# Patient Record
Sex: Female | Born: 1990
Health system: Southern US, Community
[De-identification: ages and names within clinical notes are randomized; demographics above are authoritative.]

## PROBLEM LIST (undated history)

## (undated) ENCOUNTER — Inpatient Hospital Stay (HOSPITAL_COMMUNITY): Payer: Self-pay

## (undated) DIAGNOSIS — F32A Depression, unspecified: Secondary | ICD-10-CM

## (undated) DIAGNOSIS — A6009 Herpesviral infection of other urogenital tract: Secondary | ICD-10-CM

## (undated) DIAGNOSIS — R519 Headache, unspecified: Secondary | ICD-10-CM

## (undated) DIAGNOSIS — N760 Acute vaginitis: Secondary | ICD-10-CM

## (undated) DIAGNOSIS — B9689 Other specified bacterial agents as the cause of diseases classified elsewhere: Secondary | ICD-10-CM

## (undated) DIAGNOSIS — J45909 Unspecified asthma, uncomplicated: Secondary | ICD-10-CM

## (undated) DIAGNOSIS — F329 Major depressive disorder, single episode, unspecified: Secondary | ICD-10-CM

## (undated) DIAGNOSIS — Z789 Other specified health status: Secondary | ICD-10-CM

## (undated) DIAGNOSIS — R51 Headache: Secondary | ICD-10-CM

## (undated) DIAGNOSIS — N390 Urinary tract infection, site not specified: Secondary | ICD-10-CM

## (undated) HISTORY — DX: Urinary tract infection, site not specified: N39.0

## (undated) HISTORY — DX: Herpesviral infection of other urogenital tract: A60.09

## (undated) HISTORY — PX: NO PAST SURGERIES: SHX2092

## (undated) HISTORY — DX: Headache: R51

## (undated) HISTORY — DX: Acute vaginitis: B96.89

## (undated) HISTORY — DX: Major depressive disorder, single episode, unspecified: F32.9

## (undated) HISTORY — DX: Headache, unspecified: R51.9

## (undated) HISTORY — DX: Depression, unspecified: F32.A

## (undated) HISTORY — DX: Unspecified asthma, uncomplicated: J45.909

## (undated) HISTORY — DX: Other specified bacterial agents as the cause of diseases classified elsewhere: N76.0

---

## 2017-03-09 ENCOUNTER — Inpatient Hospital Stay (HOSPITAL_COMMUNITY)
Admission: AD | Admit: 2017-03-09 | Discharge: 2017-03-09 | Disposition: A | Discharge status: Home / Self Care | Payer: Medicaid Other | Source: Ambulatory Visit | Attending: Obstetrics & Gynecology | Admitting: Obstetrics & Gynecology

## 2017-03-09 ENCOUNTER — Inpatient Hospital Stay (HOSPITAL_COMMUNITY): Payer: Medicaid Other

## 2017-03-09 ENCOUNTER — Encounter (HOSPITAL_COMMUNITY): Payer: Self-pay | Admitting: *Deleted

## 2017-03-09 ENCOUNTER — Other Ambulatory Visit: Payer: Self-pay

## 2017-03-09 DIAGNOSIS — O26899 Other specified pregnancy related conditions, unspecified trimester: Secondary | ICD-10-CM

## 2017-03-09 DIAGNOSIS — Z3A01 Less than 8 weeks gestation of pregnancy: Secondary | ICD-10-CM

## 2017-03-09 DIAGNOSIS — O26891 Other specified pregnancy related conditions, first trimester: Secondary | ICD-10-CM | POA: Diagnosis not present

## 2017-03-09 DIAGNOSIS — O3680X0 Pregnancy with inconclusive fetal viability, not applicable or unspecified: Secondary | ICD-10-CM

## 2017-03-09 DIAGNOSIS — O209 Hemorrhage in early pregnancy, unspecified: Secondary | ICD-10-CM | POA: Diagnosis present

## 2017-03-09 DIAGNOSIS — Z6791 Unspecified blood type, Rh negative: Secondary | ICD-10-CM

## 2017-03-09 HISTORY — DX: Other specified health status: Z78.9

## 2017-03-09 LAB — URINALYSIS, ROUTINE W REFLEX MICROSCOPIC
BILIRUBIN URINE: NEGATIVE
Glucose, UA: NEGATIVE mg/dL
Ketones, ur: NEGATIVE mg/dL
Leukocytes, UA: NEGATIVE
Nitrite: NEGATIVE
PH: 7 (ref 5.0–8.0)
Protein, ur: NEGATIVE mg/dL
SPECIFIC GRAVITY, URINE: 1.009 (ref 1.005–1.030)

## 2017-03-09 LAB — CBC
HCT: 42.5 % (ref 36.0–46.0)
HEMOGLOBIN: 13.8 g/dL (ref 12.0–15.0)
MCH: 28.2 pg (ref 26.0–34.0)
MCHC: 32.5 g/dL (ref 30.0–36.0)
MCV: 86.7 fL (ref 78.0–100.0)
PLATELETS: 326 10*3/uL (ref 150–400)
RBC: 4.9 MIL/uL (ref 3.87–5.11)
RDW: 14.1 % (ref 11.5–15.5)
WBC: 5.6 10*3/uL (ref 4.0–10.5)

## 2017-03-09 LAB — WET PREP, GENITAL
SPERM: NONE SEEN
TRICH WET PREP: NONE SEEN
YEAST WET PREP: NONE SEEN

## 2017-03-09 LAB — HCG, QUANTITATIVE, PREGNANCY: hCG, Beta Chain, Quant, S: 179 m[IU]/mL — ABNORMAL HIGH (ref <5)

## 2017-03-09 LAB — ABO/RH: ABO/RH(D): A NEG

## 2017-03-09 MED ORDER — RHO D IMMUNE GLOBULIN 1500 UNIT/2ML IJ SOSY
300.0000 ug | PREFILLED_SYRINGE | Freq: Once | INTRAMUSCULAR | Status: AC
  Administered 2017-03-09: 300 ug via INTRAMUSCULAR
  Filled 2017-03-09: qty 2

## 2017-03-09 NOTE — MAU Note (Signed)
Pt presents with c/o spotting with wiping on Friday and now having bright red VB since Sunday that requires a sanitary napkin.  Reports has had intermittent lower abdominal cramping and back pain.

## 2017-03-09 NOTE — Discharge Instructions (Signed)
Ectopic Pregnancy An ectopic pregnancy is when the fertilized egg attaches (implants) outside the uterus. Most ectopic pregnancies occur in one of the tubes where eggs travel from the ovary to the uterus (fallopian tubes), but the implanting can occur in other locations. In rare cases, ectopic pregnancies occur on the ovary, intestine, pelvis, abdomen, or cervix. In an ectopic pregnancy, the fertilized egg does not have the ability to develop into a normal, healthy baby. A ruptured ectopic pregnancy is one in which tearing or bursting of a fallopian tube causes internal bleeding. Often, there is intense lower abdominal pain, and vaginal bleeding sometimes occurs. Having an ectopic pregnancy can be life-threatening. If this dangerous condition is not treated, it can lead to blood loss, shock, or even death. What are the causes? The most common cause of this condition is damage to one of the fallopian tubes. A fallopian tube may be narrowed or blocked, and that keeps the fertilized egg from reaching the uterus. What increases the risk? This condition is more likely to develop in women of childbearing age who have different levels of risk. The levels of risk can be divided into three categories. High risk  You have gone through infertility treatment.  You have had an ectopic pregnancy before.  You have had surgery on the fallopian tubes, or another surgical procedure, such as an abortion.  You have had surgery to have the fallopian tubes tied (tubal ligation).  You have problems or diseases of the fallopian tubes.  You have been exposed to diethylstilbestrol (DES). This medicine was used until 1971, and it had effects on babies whose mothers took the medicine.  You become pregnant while using an IUD (intrauterine device) for birth control. Moderate risk  You have a history of infertility.  You have had an STI (sexually transmitted infection).  You have a history of pelvic inflammatory  disease (PID).  You have scarring from endometriosis.  You have multiple sexual partners.  You smoke. Low risk  You have had pelvic surgery.  You use vaginal douches.  You became sexually active before age 18. What are the signs or symptoms? Common symptoms of this condition include normal pregnancy symptoms, such as missing a period, nausea, tiredness, abdominal pain, breast tenderness, and bleeding. However, ectopic pregnancy will have additional symptoms, such as:  Pain with intercourse.  Irregular vaginal bleeding or spotting.  Cramping or pain on one side or in the lower abdomen.  Fast heartbeat, low blood pressure, and sweating.  Passing out while having a bowel movement.  Symptoms of a ruptured ectopic pregnancy and internal bleeding may include:  Sudden, severe pain in the abdomen and pelvis.  Dizziness, weakness, light-headedness, or fainting.  Pain in the shoulder or neck area.  How is this diagnosed? This condition is diagnosed by:  A pelvic exam to locate pain or a mass in the abdomen.  A pregnancy test. This blood test checks for the presence as well as the specific level of pregnancy hormone in the bloodstream.  Ultrasound. This is performed if a pregnancy test is positive. In this test, a probe is inserted into the vagina. The probe will detect a fetus, possibly in a location other than the uterus.  Taking a sample of uterus tissue (dilation and curettage, or D&C).  Surgery to perform a visual exam of the inside of the abdomen using a thin, lighted tube that has a tiny camera on the end (laparoscope).  Culdocentesis. This procedure involves inserting a needle at the top   of the vagina, behind the uterus. If blood is present in this area, it may indicate that a fallopian tube is torn.  How is this treated? This condition is treated with medicine or surgery. Medicine  An injection of a medicine (methotrexate) may be given to cause the pregnancy tissue  to be absorbed. This medicine may save your fallopian tube. It may be given if: ? The diagnosis is made early, with no signs of active bleeding. ? The fallopian tube has not ruptured. ? You are considered to be a good candidate for the medicine. Usually, pregnancy hormone blood levels are checked after methotrexate treatment. This is to be sure that the medicine is effective. It may take 4-6 weeks for the pregnancy to be absorbed. Most pregnancies will be absorbed by 3 weeks. Surgery  A laparoscope may be used to remove the pregnancy tissue.  If severe internal bleeding occurs, a larger cut (incision) may be made in the lower abdomen (laparotomy) to remove the fetus and placenta. This is done to stop the bleeding.  Part or all of the fallopian tube may be removed (salpingectomy) along with the fetus and placenta. The fallopian tube may also be repaired during the surgery.  In very rare circumstances, removal of the uterus (hysterectomy) may be required.  After surgery, pregnancy hormone testing may be done to be sure that there is no pregnancy tissue left. Whether your treatment is medicine or surgery, you may receive a Rho (D) immune globulin shot to prevent problems with any future pregnancy. This shot may be given if:  You are Rh-negative and the baby's father is Rh-positive.  You are Rh-negative and you do not know the Rh type of the baby's father.  Follow these instructions at home:  Rest and limit your activity after the procedure for as long as told by your health care provider.  Until your health care provider says that it is safe: ? Do not lift anything that is heavier than 10 lb (4.5 kg), or the limit that your health care provider tells you. ? Avoid physical exercise and any movement that requires effort (is strenuous).  To help prevent constipation: ? Eat a healthy diet that includes fruits, vegetables, and whole grains. ? Drink 6-8 glasses of water per day. Get help  right away if:  You develop worsening pain that is not relieved by medicine.  You have: ? A fever or chills. ? Vaginal bleeding. ? Redness and swelling at the incision site. ? Nausea and vomiting.  You feel dizzy or weak.  You feel light-headed or you faint. This information is not intended to replace advice given to you by your health care provider. Make sure you discuss any questions you have with your health care provider. Document Released: 04/23/2004 Document Revised: 11/13/2015 Document Reviewed: 10/16/2015 Elsevier Interactive Patient Education  2018 [employer]  

## 2017-03-09 NOTE — MAU Provider Note (Signed)
History     CSN: [id number]  Arrival date and time: 03/09/17 1338   First Provider Initiated Contact with Patient 03/09/17 1443      Chief Complaint  Patient presents with  . Vaginal Bleeding   Amanda Kerr is a 26 y.o. G1P0 at [redacted]w[redacted]d who presents today with vaginal bleeding. She started spotting on 12/7, and then on 12/9 it became heavier. It requires the use of a pantiliner, with small pea sized clots. She also started to have cramping on 03/07/17. +UPT at the HD.    Vaginal Bleeding  The patient's primary symptoms include pelvic pain and vaginal bleeding. This is a new problem. The current episode started in the past 7 days. The problem occurs constantly. The problem has been gradually worsening. Pain severity now: 4/10. The problem affects both sides. She is pregnant. Pertinent negatives include no chills, dysuria, fever, frequency, nausea, urgency or vomiting. The vaginal discharge was bloody. The vaginal bleeding is lighter than menses. She has been passing clots. She has not been passing tissue. Nothing aggravates the symptoms. She has tried nothing for the symptoms. She uses nothing for contraception. Her menstrual history has been regular (LMP 01/24/17).    Past Medical History:  Diagnosis Date  . Medical history non-contributory     Past Surgical History:  Procedure Laterality Date  . NO PAST SURGERIES      Family History  Problem Relation Age of Onset  . Hypertension Mother   . Diabetes Maternal Grandmother   . Glaucoma Maternal Grandmother   . Heart disease Maternal Grandmother     Social History   Tobacco Use  . Smoking status: Never Smoker  . Smokeless tobacco: Never Used  Substance Use Topics  . Alcohol use: No    Frequency: Never    Comment: occasional  . Drug use: No    Allergies: No Known Allergies  No medications prior to admission.    Review of Systems  Constitutional: Negative for chills and fever.  Gastrointestinal: Negative for nausea and  vomiting.  Genitourinary: Positive for pelvic pain and vaginal bleeding. Negative for dysuria, frequency and urgency.   Physical Exam   Blood pressure 119/85, pulse 99, temperature 98.2 F (36.8 C), temperature source Oral, resp. rate 20, height 5\' 7"  (1.702 m), weight 182 lb 4 oz (82.7 kg), last menstrual period 01/24/2017, SpO2 100 %.  Physical Exam  Nursing note and vitals reviewed. Constitutional: She is oriented to person, place, and time. She appears well-developed and well-nourished. No distress.  HENT:  Head: Normocephalic.  Cardiovascular: Normal rate.  Respiratory: Effort normal.  GI: Soft. There is no tenderness. There is no rebound.  Neurological: She is alert and oriented to person, place, and time.  Skin: Skin is warm and dry.  Psychiatric: She has a normal mood and affect.     Results for orders placed or performed during the hospital encounter of 03/09/17 (from the past 24 hour(s))  Urinalysis, Routine w reflex microscopic     Status: Abnormal   Collection Time: 03/09/17  2:00 PM  Result Value Ref Range   Color, Urine YELLOW YELLOW   APPearance HAZY (A) CLEAR   Specific Gravity, Urine 1.009 1.005 - 1.030   pH 7.0 5.0 - 8.0   Glucose, UA NEGATIVE NEGATIVE mg/dL   Hgb urine dipstick LARGE (A) NEGATIVE   Bilirubin Urine NEGATIVE NEGATIVE   Ketones, ur NEGATIVE NEGATIVE mg/dL   Protein, ur NEGATIVE NEGATIVE mg/dL   Nitrite NEGATIVE NEGATIVE   Leukocytes, UA  NEGATIVE NEGATIVE   RBC / HPF TOO NUMEROUS TO COUNT 0 - 5 RBC/hpf   WBC, UA 6-30 0 - 5 WBC/hpf   Bacteria, UA RARE (A) NONE SEEN   Squamous Epithelial / LPF 0-5 (A) NONE SEEN   Mucus PRESENT   CBC     Status: None   Collection Time: 03/09/17  3:21 PM  Result Value Ref Range   WBC 5.6 4.0 - 10.5 K/uL   RBC 4.90 3.87 - 5.11 MIL/uL   Hemoglobin 13.8 12.0 - 15.0 g/dL   HCT 40.942.5 81.136.0 - 91.446.0 %   MCV 86.7 78.0 - 100.0 fL   MCH 28.2 26.0 - 34.0 pg   MCHC 32.5 30.0 - 36.0 g/dL   RDW 78.214.1 95.611.5 - 21.315.5 %    Platelets 326 150 - 400 K/uL  hCG, quantitative, pregnancy     Status: Abnormal   Collection Time: 03/09/17  3:21 PM  Result Value Ref Range   hCG, Beta Chain, Quant, S 179 (H) <5 mIU/mL  ABO/Rh     Status: None (Preliminary result)   Collection Time: 03/09/17  3:21 PM  Result Value Ref Range   ABO/RH(D) A NEG   Rh IG workup (includes ABO/Rh)     Status: None (Preliminary result)   Collection Time: 03/09/17  3:21 PM  Result Value Ref Range   Gestational Age(Wks) 6    ABO/RH(D) A NEG    Antibody Screen PENDING     [place] Ob Comp Less 14 Wks  Result Date: 03/09/2017 CLINICAL DATA:  Vaginal bleeding and cramping for 4 days. Gestational age by LMP of 6 weeks 2 days. EXAM: OBSTETRIC <14 WK US AND TRANSVAGINAL OB US TECHNIQUE: Both transabdominal and transvaginal ultrasound examinations were performed for complete evaluation of the gestation as well as the maternal uterus, adnexal regions, and pelvic cul-de-sac. Transvaginal technique was performed to assess early pregnancy. COMPARISON:  None. FINDINGS: Intrauterine gestational sac: None Maternal uterus/adnexae: Endometrial thickness measures 5 mm. No fibroids identified. Both ovaries are normal in appearance. No adnexal mass or free fluid identified. IMPRESSION: Pregnancy of unknown anatomic location (no intrauterine gestational sac or adnexal mass identified). Differential diagnosis includes recent spontaneous abortion, IUP too early to visualize, and non-visualized ectopic pregnancy. Recommend correlation with serial beta-hCG levels, and follow up US as clinically warranted. Electronically Signed   By: Myles RosenthalJohn  Stahl M.D.   On: 03/09/2017 16:39   [place] Ob Transvaginal  Result Date: 03/09/2017 CLINICAL DATA:  Vaginal bleeding and cramping for 4 days. Gestational age by LMP of 6 weeks 2 days. EXAM: OBSTETRIC <14 WK US AND TRANSVAGINAL OB US TECHNIQUE: Both transabdominal and transvaginal ultrasound examinations were performed for complete evaluation of the  gestation as well as the maternal uterus, adnexal regions, and pelvic cul-de-sac. Transvaginal technique was performed to assess early pregnancy. COMPARISON:  None. FINDINGS: Intrauterine gestational sac: None Maternal uterus/adnexae: Endometrial thickness measures 5 mm. No fibroids identified. Both ovaries are normal in appearance. No adnexal mass or free fluid identified. IMPRESSION: Pregnancy of unknown anatomic location (no intrauterine gestational sac or adnexal mass identified). Differential diagnosis includes recent spontaneous abortion, IUP too early to visualize, and non-visualized ectopic pregnancy. Recommend correlation with serial beta-hCG levels, and follow up US as clinically warranted. Electronically Signed   By: Myles RosenthalJohn  Stahl M.D.   On: 03/09/2017 16:39   MAU Course  Procedures  MDM Rhogam given today.   Assessment and Plan   1. Pregnancy, location unknown   2. Rh negative, antepartum  DC home Comfort measures reviewed  Rhogam given today  Bleeding precautions Ectopic precautions RX: none  Return to MAU as needed FU with the clinic on 03/11/17 for repeat blood work   Follow-up Information    [facility]   Specialty:  Obstetrics and Gynecology Why:  Thursday 03/11/17 for repeat blood work  Contact information: [address] [place] [id number] [phone]           Thressa ShellerHeather Hogan 03/09/2017, 2:44 PM

## 2017-03-10 LAB — GC/CHLAMYDIA PROBE AMP (~~LOC~~) NOT AT ARMC
Chlamydia: NEGATIVE
Neisseria Gonorrhea: NEGATIVE

## 2017-03-10 LAB — RH IG WORKUP (INCLUDES ABO/RH)
ABO/RH(D): A NEG
Antibody Screen: NEGATIVE
GESTATIONAL AGE(WKS): 6
Unit division: 0

## 2017-03-11 ENCOUNTER — Ambulatory Visit: Payer: Self-pay | Admitting: *Deleted

## 2017-03-11 DIAGNOSIS — O3680X0 Pregnancy with inconclusive fetal viability, not applicable or unspecified: Secondary | ICD-10-CM

## 2017-03-11 LAB — HCG, QUANTITATIVE, PREGNANCY: HCG, BETA CHAIN, QUANT, S: 67 m[IU]/mL — AB (ref <5)

## 2017-03-11 NOTE — Progress Notes (Signed)
Here for stat bhcg today. C/o spotting dark brown. Denies pain. Explained to her we will draw a stat bhcg and have her wait for results , then review with provider and her ; along with plan of care.  Results back; discussed with Dr. Shawnie PonsPratt and then informed patient it appears she has had a miscarriage and that we recommend a non stat bhcg in one week and then see a provider in 2 weeks for follow up.Ectopic precautions given.  Answered her questions and she voices understanding.

## 2017-03-11 NOTE — Progress Notes (Signed)
Patient seen and assessed by nursing staff.  Agree with documentation and plan.  

## 2017-03-19 ENCOUNTER — Other Ambulatory Visit: Payer: Medicaid Other

## 2017-03-19 DIAGNOSIS — O3680X0 Pregnancy with inconclusive fetal viability, not applicable or unspecified: Secondary | ICD-10-CM

## 2017-03-20 LAB — BETA HCG QUANT (REF LAB): HCG QUANT: 4 m[IU]/mL

## 2017-03-25 ENCOUNTER — Ambulatory Visit (INDEPENDENT_AMBULATORY_CARE_PROVIDER_SITE_OTHER): Payer: Medicaid Other | Admitting: Obstetrics and Gynecology

## 2017-03-25 ENCOUNTER — Encounter: Payer: Self-pay | Admitting: Obstetrics and Gynecology

## 2017-03-25 VITALS — BP 134/68 | HR 75 | Ht 67.0 in | Wt 184.4 lb

## 2017-03-25 DIAGNOSIS — A6000 Herpesviral infection of urogenital system, unspecified: Secondary | ICD-10-CM

## 2017-03-25 DIAGNOSIS — O039 Complete or unspecified spontaneous abortion without complication: Secondary | ICD-10-CM | POA: Diagnosis present

## 2017-03-25 NOTE — Progress Notes (Signed)
GYNECOLOGY OFFICE VISIT NOTE  History:  26 y.o. G1P0010 here today for follow up from SAB. She denies any abnormal vaginal discharge, bleeding, pelvic pain or other concerns.   Past Medical History:  Diagnosis Date  . Medical history non-contributory     Past Surgical History:  Procedure Laterality Date  . NO PAST SURGERIES       Current Outpatient Medications:  .  Prenatal Vit-Fe Fumarate-FA (PRENATAL MULTIVITAMIN) TABS tablet, Take 1 tablet by mouth daily at 12 noon., Disp: , Rfl:  .  valACYclovir (VALTREX) 500 MG tablet, Take 500 mg by mouth daily as needed (outbreak)., Disp: , Rfl:   The following portions of the patient's history were reviewed and updated as appropriate: allergies, current medications, past family history, past medical history, past social history, past surgical history and problem list.    Review of Systems:  Pertinent items noted in HPI and remainder of comprehensive ROS otherwise negative.   Objective:  Physical Exam BP 134/68   Pulse 75   Ht 5\' 7"  (1.702 m)   Wt 184 lb 6.4 oz (83.6 kg)   LMP 01/24/2017 (Approximate)   BMI 28.88 kg/m  CONSTITUTIONAL: Well-developed, well-nourished female in no acute distress.  HENT:  Normocephalic, atraumatic. External right and left ear normal. Oropharynx is clear and moist EYES: Conjunctivae and EOM are normal. Pupils are equal, round, and reactive to light. No scleral icterus.  NECK: Normal range of motion, supple, no masses SKIN: Skin is warm and dry. No rash noted. Not diaphoretic. No erythema. No pallor. NEUROLOGIC: Alert and oriented to person, place, and time. Normal reflexes, muscle tone coordination. No cranial nerve deficit noted. PSYCHIATRIC: Normal mood and affect. Normal behavior. Normal judgment and thought content. CARDIOVASCULAR: Normal heart rate noted RESPIRATORY: Effort normal, no problems with respiration noted ABDOMEN: Soft, no distention noted.   PELVIC: Deferred MUSCULOSKELETAL: Normal  range of motion. No edema noted.  Labs and Imaging [place] Ob Comp Less 14 Wks  Result Date: 03/09/2017 CLINICAL DATA:  Vaginal bleeding and cramping for 4 days. Gestational age by LMP of 6 weeks 2 days. EXAM: OBSTETRIC <14 WK US AND TRANSVAGINAL OB US TECHNIQUE: Both transabdominal and transvaginal ultrasound examinations were performed for complete evaluation of the gestation as well as the maternal uterus, adnexal regions, and pelvic cul-de-sac. Transvaginal technique was performed to assess early pregnancy. COMPARISON:  None. FINDINGS: Intrauterine gestational sac: None Maternal uterus/adnexae: Endometrial thickness measures 5 mm. No fibroids identified. Both ovaries are normal in appearance. No adnexal mass or free fluid identified. IMPRESSION: Pregnancy of unknown anatomic location (no intrauterine gestational sac or adnexal mass identified). Differential diagnosis includes recent spontaneous abortion, IUP too early to visualize, and non-visualized ectopic pregnancy. Recommend correlation with serial beta-hCG levels, and follow up US as clinically warranted. Electronically Signed   By: Myles RosenthalJohn  Stahl M.D.   On: 03/09/2017 16:39   [place] Ob Transvaginal  Result Date: 03/09/2017 CLINICAL DATA:  Vaginal bleeding and cramping for 4 days. Gestational age by LMP of 6 weeks 2 days. EXAM: OBSTETRIC <14 WK US AND TRANSVAGINAL OB US TECHNIQUE: Both transabdominal and transvaginal ultrasound examinations were performed for complete evaluation of the gestation as well as the maternal uterus, adnexal regions, and pelvic cul-de-sac. Transvaginal technique was performed to assess early pregnancy. COMPARISON:  None. FINDINGS: Intrauterine gestational sac: None Maternal uterus/adnexae: Endometrial thickness measures 5 mm. No fibroids identified. Both ovaries are normal in appearance. No adnexal mass or free fluid identified. IMPRESSION: Pregnancy of unknown anatomic location (no  intrauterine gestational sac or adnexal mass  identified). Differential diagnosis includes recent spontaneous abortion, IUP too early to visualize, and non-visualized ectopic pregnancy. Recommend correlation with serial beta-hCG levels, and follow up US as clinically warranted. Electronically Signed   By: Myles RosenthalJohn  Stahl M.D.   On: 03/09/2017 16:39    Assessment & Plan:  1. SAB (spontaneous abortion) Reviewed etiology and likely genetic issues with embryo. Reviewed that no testing is indicated unless she has recurrent miscarriages. Reviewed that she should wait to have one period and then may try again. Reviewed importance of taking prenatal vitamins now, prior to pregnancy rather than waiting until she finds out she is pregnant. Reviewed that when she does have a positive pregnancy test, she should start prenatal care. Answered all questions.   2. Genital herpes simplex, unspecified site Reviewed that HSV outbreaks or history thereof is generally non-contributory towards SAB and would only matter regarding delivery plan Reviewed delivery plans for patients with HSV in detail and need for valtrex ppx.   Routine preventative health maintenance measures emphasized. Please refer to After Visit Summary for other counseling recommendations.    Baldemar LenisK. Meryl Oluwanifemi Petitti, M.D. Attending Obstetrician & Gynecologist, [facility], [facility]

## 2017-03-25 NOTE — Progress Notes (Signed)
Pt. States some back pain occasionally feels may be due to work.

## 2017-03-26 ENCOUNTER — Ambulatory Visit: Payer: Self-pay | Admitting: Certified Nurse Midwife

## 2017-11-09 ENCOUNTER — Encounter: Payer: Self-pay | Admitting: Family Medicine

## 2017-11-09 ENCOUNTER — Ambulatory Visit (INDEPENDENT_AMBULATORY_CARE_PROVIDER_SITE_OTHER): Payer: 59 | Admitting: Family Medicine

## 2017-11-09 VITALS — BP 110/78 | HR 98 | Temp 97.9°F | Ht 67.0 in

## 2017-11-09 DIAGNOSIS — Z7689 Persons encountering health services in other specified circumstances: Secondary | ICD-10-CM | POA: Diagnosis not present

## 2017-11-09 DIAGNOSIS — J020 Streptococcal pharyngitis: Secondary | ICD-10-CM | POA: Diagnosis not present

## 2017-11-09 DIAGNOSIS — Z8709 Personal history of other diseases of the respiratory system: Secondary | ICD-10-CM | POA: Insufficient documentation

## 2017-11-09 DIAGNOSIS — F419 Anxiety disorder, unspecified: Secondary | ICD-10-CM | POA: Insufficient documentation

## 2017-11-09 DIAGNOSIS — R208 Other disturbances of skin sensation: Secondary | ICD-10-CM

## 2017-11-09 DIAGNOSIS — F32A Depression, unspecified: Secondary | ICD-10-CM | POA: Insufficient documentation

## 2017-11-09 DIAGNOSIS — F329 Major depressive disorder, single episode, unspecified: Secondary | ICD-10-CM

## 2017-11-09 HISTORY — DX: Personal history of other diseases of the respiratory system: Z87.09

## 2017-11-09 MED ORDER — AMOXICILLIN 500 MG PO CAPS: 500.0000 mg | ORAL_CAPSULE | Freq: Two times a day (BID) | ORAL | 0 refills | Status: AC

## 2017-11-09 NOTE — Progress Notes (Signed)
Patient presents to clinic today to establish care.  SUBJECTIVE: PMH: pt is a 27 yo female with pmh sig for h/o asthma and depression.  Pt has not seen a provider "in a while".  Was seen by Ob/Gyn in Dec for miscarriage.  Ear pain, acute: -Patient endorses right ear pain and sore throat x1 week -Has not taken anything for this.  Depression: -Pt endorses feeling down since miscarriage in December 2018. -Pt has considered going to counseling -pt has never been on medication for this. -Currently patient endorses good mood, good sleep, slightly decreased energy but able to get up. -Denies SI/HI.  History of asthma: -In the past problem -Patient denies current symptoms or recent illness. -Does not take anything for this.  Genital herpes: -Takes valacyclovir as needed for outbreaks. -Does not have outbreaks often.  Allodynia -Endorses skin sensitivity to touch 1-2 times per month -We will use Aspercreme/arthritis cream  Allergies: NKDA  Social history: Patient is single.  She recently graduated [employer] with a degree in sociology.  Patient currently works as a Museum/gallery conservatorcustomer service rep.  Patient endorses social alcohol use.  Patient denies tobacco and drug use.  LMP 11/05/2017-11/09/2017.  Family medical history: Mom-Alive, HTN Dad-Alive, HLD, HTN Sister-Candace, alive, asthma Sister-Kierra, alive MGM-alive, diabetes, HTN PGM-deceased, heart disease PGF-deceased, prostate cancer   Health Maintenance: Immunizations -- PAP --unsure may be 2016 or 2018.   Past Medical History:  Diagnosis Date  . Asthma   . Depression   . Frequent headaches   . Medical history non-contributory   . UTI (urinary tract infection)     Past Surgical History:  Procedure Laterality Date  . NO PAST SURGERIES      Current Outpatient Medications on File Prior to Visit  Medication Sig Dispense Refill  . valACYclovir (VALTREX) 500 MG tablet Take 500 mg by mouth daily as needed  (outbreak).     No current facility-administered medications on file prior to visit.     No Known Allergies  Family History  Problem Relation Age of Onset  . Hypertension Mother   . Diabetes Maternal Grandmother   . Glaucoma Maternal Grandmother   . Heart disease Maternal Grandmother   . Hypertension Father   . Hyperlipidemia Father   . Asthma Sister   . Heart disease Paternal Grandmother   . Cancer Paternal Grandfather     Social History   Socioeconomic History  . Marital status: Single    Spouse name: Not on file  . Number of children: Not on file  . Years of education: Not on file  . Highest education level: Not on file  Occupational History  . Not on file  Social Needs  . Financial resource strain: Not on file  . Food insecurity:    Worry: Not on file    Inability: Not on file  . Transportation needs:    Medical: Not on file    Non-medical: Not on file  Tobacco Use  . Smoking status: Never Smoker  . Smokeless tobacco: Never Used  Substance and Sexual Activity  . Alcohol use: Yes    Alcohol/week: 1.0 standard drinks    Types: 1 Glasses of wine per week    Frequency: Never    Comment: occasional  . Drug use: No  . Sexual activity: Yes    Birth control/protection: None  Lifestyle  . Physical activity:    Days per week: Not on file    Minutes per session: Not on file  .  Stress: Not on file  Relationships  . Social connections:    Talks on phone: Not on file    Gets together: Not on file    Attends religious service: Not on file    Active member of club or organization: Not on file    Attends meetings of clubs or organizations: Not on file    Relationship status: Not on file  . Intimate partner violence:    Fear of current or ex partner: Not on file    Emotionally abused: Not on file    Physically abused: Not on file    Forced sexual activity: Not on file  Other Topics Concern  . Not on file  Social History Narrative  . Not on file     ROS General: Denies fever, chills, night sweats, changes in weight, changes in appetite HEENT: Denies headaches, changes in vision, rhinorrhea   +R ear pain, sore throat CV: Denies CP, palpitations, SOB, orthopnea Pulm: Denies SOB, cough, wheezing GI: Denies abdominal pain, nausea, vomiting, diarrhea, constipation GU: Denies dysuria, hematuria, frequency, vaginal discharge +h/o HSV Msk: Denies muscle cramps, joint pains Neuro: Denies weakness, numbness, tingling Skin: Denies rashes, bruising   +h/o skin sensitivity to touch Psych: Denies anxiety, hallucinations   +depression   BP 110/78   Pulse 98   Temp 97.9 F (36.6 C)   Ht 5\' 7"  (1.702 m)   LMP 11/05/2017 (Exact Date)   SpO2 98%   BMI 28.88 kg/m   Physical Exam Gen. Pleasant, well developed, well-nourished, in NAD HEENT - /AT, PERRL, no scleral icterus, no nasal drainage, pharynx with erythema and exudate R >L.  +cervical lymphadenopathy. Lungs: no use of accessory muscles, CTAB, no wheezes, rales or rhonchi Cardiovascular: RRR, No r/g/m, no peripheral edema Abdomen: BS present, soft, nontender, nondistended Neuro:  A&Ox3, CN II-XII intact, normal gait Skin:  Warm, dry, intact, no lesions  No results found for this or any previous visit (from the past 2160 hour(s)).  Assessment/Plan: Strep pharyngitis  -Rapid strep positive. -Given handout - Plan: amoxicillin (AMOXIL) 500 MG capsule x 10 days  Anxiety and depression -Gad 7 score 8 -PHQ 9 score 8 -Patient given information about scheduling appointment for counseling. -Continue to monitor  History of asthma -Currently stable  Encounter to establish care -We reviewed the PMH, PSH, FH, SH, Meds and Allergies. -We provided refills for any medications we will prescribe as needed. -We addressed current concerns per orders and patient instructions. -We have asked for records for pertinent exams, studies, vaccines and notes from previous providers. -We have  advised patient to follow up per instructions below.  Allodynia -Continue current treatment  Follow-up in the next few months, sooner if needed. Discussed follow-up next week if still having sore throat, fever, or develops rash.  Abbe AmsterdamShannon Semisi Biela, MD

## 2017-11-09 NOTE — Patient Instructions (Signed)
Strep Throat Strep throat is a bacterial infection of the throat. Your health care provider may call the infection tonsillitis or pharyngitis, depending on whether there is swelling in the tonsils or at the back of the throat. Strep throat is most common during the cold months of the year in children who are 5-28 years of age, but it can happen during any season in people of any age. This infection is spread from person to person (contagious) through coughing, sneezing, or close contact. What are the causes? Strep throat is caused by the bacteria called Streptococcus pyogenes. What increases the risk? This condition is more likely to develop in:  People who spend time in crowded places where the infection can spread easily.  People who have close contact with someone who has strep throat.  What are the signs or symptoms? Symptoms of this condition include:  Fever or chills.  Redness, swelling, or pain in the tonsils or throat.  Pain or difficulty when swallowing.  White or yellow spots on the tonsils or throat.  Swollen, tender glands in the neck or under the jaw.  Red rash all over the body (rare).  How is this diagnosed? This condition is diagnosed by performing a rapid strep test or by taking a swab of your throat (throat culture test). Results from a rapid strep test are usually ready in a few minutes, but throat culture test results are available after one or two days. How is this treated? This condition is treated with antibiotic medicine. Follow these instructions at home: Medicines  Take over-the-counter and prescription medicines only as told by your health care provider.  Take your antibiotic as told by your health care provider. Do not stop taking the antibiotic even if you start to feel better.  Have family members who also have a sore throat or fever tested for strep throat. They may need antibiotics if they have the strep infection. Eating and drinking  Do not  share food, drinking cups, or personal items that could cause the infection to spread to other people.  If swallowing is difficult, try eating soft foods until your sore throat feels better.  Drink enough fluid to keep your urine clear or pale yellow. General instructions  Gargle with a salt-water mixture 3-4 times per day or as needed. To make a salt-water mixture, completely dissolve -1 tsp of salt in 1 cup of warm water.  Make sure that all household members wash their hands well.  Get plenty of rest.  Stay home from school or work until you have been taking antibiotics for 24 hours.  Keep all follow-up visits as told by your health care provider. This is important. Contact a health care provider if:  The glands in your neck continue to get bigger.  You develop a rash, cough, or earache.  You cough up a thick liquid that is green, yellow-brown, or bloody.  You have pain or discomfort that does not get better with medicine.  Your problems seem to be getting worse rather than better.  You have a fever. Get help right away if:  You have new symptoms, such as vomiting, severe headache, stiff or painful neck, chest pain, or shortness of breath.  You have severe throat pain, drooling, or changes in your voice.  You have swelling of the neck, or the skin on the neck becomes red and tender.  You have signs of dehydration, such as fatigue, dry mouth, and decreased urination.  You become increasingly sleepy, or   you cannot wake up completely.  Your joints become red or painful. This information is not intended to replace advice given to you by your health care provider. Make sure you discuss any questions you have with your health care provider. Document Released: 03/13/2000 Document Revised: 11/13/2015 Document Reviewed: 07/09/2014 Elsevier Interactive Patient Education  2018 [employer]  Living With Depression Everyone experiences occasional disappointment, sadness, and  loss in their lives. When you are feeling down, blue, or sad for at least 2 weeks in a row, it may mean that you have depression. Depression can affect your thoughts and feelings, relationships, daily activities, and physical health. It is caused by changes in the way your brain functions. If you receive a diagnosis of depression, your health care provider will tell you which type of depression you have and what treatment options are available to you. If you are living with depression, there are ways to help you recover from it and also ways to prevent it from coming back. How to cope with lifestyle changes Coping with stress Stress is your body's reaction to life changes and events, both good and bad. Stressful situations may include:  Getting married.  The death of a spouse.  Losing a job.  Retiring.  Having a baby.  Stress can last just a few hours or it can be ongoing. Stress can play a major role in depression, so it is important to learn both how to cope with stress and how to think about it differently. Talk with your health care provider or a counselor if you would like to learn more about stress reduction. He or she may suggest some stress reduction techniques, such as:  Music therapy. This can include creating music or listening to music. Choose music that you enjoy and that inspires you.  Mindfulness-based meditation. This kind of meditation can be done while sitting or walking. It involves being aware of your normal breaths, rather than trying to control your breathing.  Centering prayer. This is a kind of meditation that involves focusing on a spiritual word or phrase. Choose a word, phrase, or sacred image that is meaningful to you and that brings you peace.  Deep breathing. To do this, expand your stomach and inhale slowly through your nose. Hold your breath for 3-5 seconds, then exhale slowly, allowing your stomach muscles to relax.  Muscle relaxation. This involves  intentionally tensing muscles then relaxing them.  Choose a stress reduction technique that fits your lifestyle and personality. Stress reduction techniques take time and practice to develop. Set aside 5-15 minutes a day to do them. Therapists can offer training in these techniques. The training may be covered by some insurance plans. Other things you can do to manage stress include:  Keeping a stress diary. This can help you learn what triggers your stress and ways to control your response.  Understanding what your limits are and saying no to requests or events that lead to a schedule that is too full.  Thinking about how you respond to certain situations. You may not be able to control everything, but you can control how you react.  Adding humor to your life by watching funny films or TV shows.  Making time for activities that help you relax and not feeling guilty about spending your time this way.  Medicines Your health care provider may suggest certain medicines if he or she feels that they will help improve your condition. Avoid using alcohol and other substances that may prevent your  medicines from working properly (may interact). It is also important to:  Talk with your pharmacist or health care provider about all the medicines that you take, their possible side effects, and what medicines are safe to take together.  Make it your goal to take part in all treatment decisions (shared decision-making). This includes giving input on the side effects of medicines. It is best if shared decision-making with your health care provider is part of your total treatment plan.  If your health care provider prescribes a medicine, you may not notice the full benefits of it for 4-8 weeks. Most people who are treated for depression need to be on medicine for at least 6-12 months after they feel better. If you are taking medicines as part of your treatment, do not stop taking medicines without first talking  to your health care provider. You may need to have the medicine slowly decreased (tapered) over time to decrease the risk of harmful side effects. Relationships Your health care provider may suggest family therapy along with individual therapy and drug therapy. While there may not be family problems that are causing you to feel depressed, it is still important to make sure your family learns as much as they can about your mental health. Having your family's support can help make your treatment successful. How to recognize changes in your condition Everyone has a different response to treatment for depression. Recovery from major depression happens when you have not had signs of major depression for two months. This may mean that you will start to:  Have more interest in doing activities.  Feel less hopeless than you did 2 months ago.  Have more energy.  Overeat less often, or have better or improving appetite.  Have better concentration.  Your health care provider will work with you to decide the next steps in your recovery. It is also important to recognize when your condition is getting worse. Watch for these signs:  Having fatigue or low energy.  Eating too much or too little.  Sleeping too much or too little.  Feeling restless, agitated, or hopeless.  Having trouble concentrating or making decisions.  Having unexplained physical complaints.  Feeling irritable, angry, or aggressive.  Get help as soon as you or your family members notice these symptoms coming back. How to get support and help from others How to talk with friends and family members about your condition Talking to friends and family members about your condition can provide you with one way to get support and guidance. Reach out to trusted friends or family members, explain your symptoms to them, and let them know that you are working with a health care provider to treat your depression. Financial resources Not  all insurance plans cover mental health care, so it is important to check with your insurance carrier. If paying for co-pays or counseling services is a problem, search for a local or county mental health care center. They may be able to offer public mental health care services at low or no cost when you are not able to see a private health care provider. If you are taking medicine for depression, you may be able to get the generic form, which may be less expensive. Some makers of prescription medicines also offer help to patients who cannot afford the medicines they need. Follow these instructions at home:  Get the right amount and quality of sleep.  Cut down on using caffeine, tobacco, alcohol, and other potentially harmful substances.  Try  to exercise, such as walking or lifting small weights.  Take over-the-counter and prescription medicines only as told by your health care provider.  Eat a healthy diet that includes plenty of vegetables, fruits, whole grains, low-fat dairy products, and lean protein. Do not eat a lot of foods that are high in solid fats, added sugars, or salt.  Keep all follow-up visits as told by your health care provider. This is important. Contact a health care provider if:  You stop taking your antidepressant medicines, and you have any of these symptoms: ? Nausea. ? Headache. ? Feeling lightheaded. ? Chills and body aches. ? Not being able to sleep (insomnia).  You or your friends and family think your depression is getting worse. Get help right away if:  You have thoughts of hurting yourself or others. If you ever feel like you may hurt yourself or others, or have thoughts about taking your own life, get help right away. You can go to your nearest emergency department or call:  Your local emergency services (911 in the [place]  A suicide crisis helpline, such as the National Suicide Prevention Lifeline at (541) 458-6944. This is open 24-hours a  day.  Summary  If you are living with depression, there are ways to help you recover from it and also ways to prevent it from coming back.  Work with your health care team to create a management plan that includes counseling, stress management techniques, and healthy lifestyle habits. This information is not intended to replace advice given to you by your health care provider. Make sure you discuss any questions you have with your health care provider. Document Released: 02/17/2016 Document Revised: 02/17/2016 Document Reviewed: 02/17/2016 Elsevier Interactive Patient Education  [employer].

## 2018-04-11 ENCOUNTER — Ambulatory Visit: Payer: Self-pay

## 2018-04-11 NOTE — Telephone Encounter (Signed)
Noted  

## 2018-04-11 NOTE — Telephone Encounter (Signed)
Pt. Reports she noticed 2 months ago - having abdominal pain.Comes and goes. Hurts at the belly button level, right and left sides. Pain only "last a few seconds." Pain is a "2". Has some problems with constipation. "I've been trying to drink more water." States sometimes she pain at both collar bones.Appointment made for Wed.Instructed if symptoms worsen to call back. Reason for Disposition . [1] MODERATE pain (e.g., interferes with normal activities) AND [2] pain comes and goes (cramps) AND [3] present > 24 hours  (Exception: pain with Vomiting or Diarrhea - see that Guideline)  Answer Assessment - Initial Assessment Questions 1. LOCATION: "Where does it hurt?"      At belly button - right and left 2. RADIATION: "Does the pain shoot anywhere else?" (e.g., chest, back)     No 3. ONSET: "When did the pain begin?" (e.g., minutes, hours or days ago)      Started a few months ago 4. SUDDEN: "Gradual or sudden onset?"     Gradual 5. PATTERN "Does the pain come and go, or is it constant?"    - If constant: "Is it getting better, staying the same, or worsening?"      (Note: Constant means the pain never goes away completely; most serious pain is constant and it progresses)     - If intermittent: "How long does it last?" "Do you have pain now?"     (Note: Intermittent means the pain goes away completely between bouts)     Comes and goes 6. SEVERITY: "How bad is the pain?"  (e.g., Scale 1-10; mild, moderate, or severe)   - MILD (1-3): doesn't interfere with normal activities, abdomen soft and not tender to touch    - MODERATE (4-7): interferes with normal activities or awakens from sleep, tender to touch    - SEVERE (8-10): excruciating pain, doubled over, unable to do any normal activities      2 7. RECURRENT SYMPTOM: "Have you ever had this type of abdominal pain before?" If so, ask: "When was the last time?" and "What happened that time?"      No 8. CAUSE: "What do you think is causing the  abdominal pain?"     Unsure 9. RELIEVING/AGGRAVATING FACTORS: "What makes it better or worse?" (e.g., movement, antacids, bowel movement)     Better after a BM 10. OTHER SYMPTOMS: "Has there been any vomiting, diarrhea, constipation, or urine problems?"       cONSTIPATION 11. PREGNANCY: "Is there any chance you are pregnant?" "When was your last menstrual period?"       No  Protocols used: ABDOMINAL PAIN - [facility]

## 2018-04-13 ENCOUNTER — Encounter: Payer: Self-pay | Admitting: Family Medicine

## 2018-04-13 ENCOUNTER — Ambulatory Visit: Payer: 59 | Admitting: Family Medicine

## 2018-04-13 VITALS — BP 98/80 | HR 100 | Temp 98.3°F | Wt 186.0 lb

## 2018-04-13 DIAGNOSIS — K5900 Constipation, unspecified: Secondary | ICD-10-CM | POA: Diagnosis not present

## 2018-04-13 DIAGNOSIS — R079 Chest pain, unspecified: Secondary | ICD-10-CM | POA: Diagnosis not present

## 2018-04-13 NOTE — Progress Notes (Signed)
Subjective:    Patient ID: Amanda Kerr, female    DOB: Apr 05, 1990, 28 y.o.   MRN: [id number]  No chief complaint on file.   HPI Patient was seen today for ongoing concern.  Pt endorses sharp abdominal pain that lasts 1 to 2 seconds.  Also with flatus and bloating x a few months.  Pt notes BMs every 2 days, may have diarrhea once a month.  Pain may occur a few times per day.  Pt drinks more water when the pain occurs.  Pt states she is eating fast food daily, does not eat vegetables.  Pt gets off work at 8 PM and may have made dinner by 9 PM.  Pt goes to bed at midnight.  Pt denies sour acid taste, burning sensation in chest, a.m. cough.  Pt has not tried anything for her symptoms.  LMP the end of Dec.  Pt also notes a "slight pain" in her chest that lasts a few seconds.  Pain is in left upper chest.  Pt states pain can occur at any time.  Pt denies numbness/tingling upper extremities, neck, or face.  Pt will blood per chest for relief.  Pt has a history of anxiety.  States has been trying to decrease stress.  Got a massage last week.  Past Medical History:  Diagnosis Date  . Asthma   . Depression   . Frequent headaches   . Medical history non-contributory   . UTI (urinary tract infection)     No Known Allergies  ROS General: Denies fever, chills, night sweats, changes in weight, changes in appetite HEENT: Denies headaches, ear pain, changes in vision, rhinorrhea, sore throat CV: Denies palpitations, SOB, orthopnea  +CP Pulm: Denies SOB, cough, wheezing GI: Denies nausea, vomiting, diarrhea   +abdominal pain, constipation, flatus, bloating GU: Denies dysuria, hematuria, frequency, vaginal discharge Msk: Denies muscle cramps, joint pains Neuro: Denies weakness, numbness, tingling Skin: Denies rashes, bruising Psych: Denies depression, anxiety, hallucinations     Objective:    Blood pressure 98/80, pulse 100, temperature 98.3 F (36.8 C), temperature source Oral, weight 186  lb (84.4 kg), last menstrual period 04/22/2017, SpO2 99 %.   Gen. Pleasant, well-nourished, in no distress, normal affect   HEENT: [place]/AT, face symmetric, no scleral icterus, PERRLA, nares patent without drainage. Lungs: no accessory muscle use, CTAB, no wheezes or rales Cardiovascular: RRR, no m/r/g, no peripheral edema.  No TTP of chest, no deformities. Abdomen: BS present, soft, NT/ND, no hepatosplenomegaly. Neuro:  A&Ox3, CN II-XII intact, normal gait   Wt Readings from Last 3 Encounters:  04/13/18 186 lb (84.4 kg)  03/25/17 184 lb 6.4 oz (83.6 kg)  03/09/17 182 lb 4 oz (82.7 kg)    Lab Results  Component Value Date   WBC 5.6 03/09/2017   HGB 13.8 03/09/2017   HCT 42.5 03/09/2017   PLT 326 03/09/2017    Assessment/Plan:  Constipation, unspecified constipation type -pt advised to decrease the amount of fast foods eaten daily -Patient encouraged to increase p.o. intake of water, fruits, and vegetables -Given handouts -Discussed using probiotics -Also discussed possible IBS given intermittent constipation and diarrhea.  Given handout -Follow-up in 1 month, sooner if needed.  Chest pain, unspecified type -likely 2/2 MSK cause or anxiety.  No red flags noted. -PHQ 9 score 9  Was 8 on 11/09/17 -GAD 7 score 11.  Was 8 on 11/09/17 -discussed ways to reduce stress such as exercise, counseling, reading a book, etc. -given RTC or ED precautions  F/u prn in a month  Abbe Amsterdam, MD

## 2018-04-13 NOTE — Patient Instructions (Signed)
Constipation, Adult Constipation is when a person has fewer bowel movements in a week than normal, has difficulty having a bowel movement, or has stools that are dry, hard, or larger than normal. Constipation may be caused by an underlying condition. It may become worse with age if a person takes certain medicines and does not take in enough fluids. Follow these instructions at home: Eating and drinking   Eat foods that have a lot of fiber, such as fresh fruits and vegetables, whole grains, and beans.  Limit foods that are high in fat, low in fiber, or overly processed, such as french fries, hamburgers, cookies, candies, and soda.  Drink enough fluid to keep your urine clear or pale yellow. General instructions  Exercise regularly or as told by your health care provider.  Go to the restroom when you have the urge to go. Do not hold it in.  Take over-the-counter and prescription medicines only as told by your health care provider. These include any fiber supplements.  Practice pelvic floor retraining exercises, such as deep breathing while relaxing the lower abdomen and pelvic floor relaxation during bowel movements.  Watch your condition for any changes.  Keep all follow-up visits as told by your health care provider. This is important. Contact a health care provider if:  You have pain that gets worse.  You have a fever.  You do not have a bowel movement after 4 days.  You vomit.  You are not hungry.  You lose weight.  You are bleeding from the anus.  You have thin, pencil-like stools. Get help right away if:  You have a fever and your symptoms suddenly get worse.  You leak stool or have blood in your stool.  Your abdomen is bloated.  You have severe pain in your abdomen.  You feel dizzy or you faint. This information is not intended to replace advice given to you by your health care provider. Make sure you discuss any questions you have with your health care  provider. Document Released: 12/13/2003 Document Revised: 10/04/2015 Document Reviewed: 09/04/2015 Elsevier Interactive Patient Education  2019 [employer]  Diet for Irritable Bowel Syndrome When you have irritable bowel syndrome (IBS), it is very important to eat the foods and follow the eating habits that are best for your condition. IBS may cause various symptoms such as pain in the abdomen, constipation, or diarrhea. Choosing the right foods can help to ease the discomfort from these symptoms. Work with your health care provider and diet and nutrition specialist (dietitian) to find the eating plan that will help to control your symptoms. What are tips for following this plan?      Keep a food diary. This will help you identify foods that cause symptoms. Write down: ? What you eat and when you eat it. ? What symptoms you have. ? When symptoms occur in relation to your meals, such as "pain in abdomen 2 hours after dinner."  Eat your meals slowly and in a relaxed setting.  Aim to eat 5-6 small meals per day. Do not skip meals.  Drink enough fluid to keep your urine pale yellow.  Ask your health care provider if you should take an over-the-counter probiotic to help restore healthy bacteria in your gut (digestive tract). ? Probiotics are foods that contain good bacteria and yeasts.  Your dietitian may have specific dietary recommendations for you based on your symptoms. He or she may recommend that you: ? Avoid foods that cause symptoms. Talk with  your dietitian about other ways to get the same nutrients that are in those problem foods. ? Avoid foods with gluten. Gluten is a protein that is found in rye, wheat, and barley. ? Eat more foods that contain soluble fiber. Examples of foods with high soluble fiber include oats, seeds, and certain fruits and vegetables. Take a fiber supplement if directed by your dietitian. ? Reduce or avoid certain foods called FODMAPs. These are foods that  contain carbohydrates that are hard to digest. Ask your doctor which foods contain these carbohydrates. What foods are not recommended? The following are some foods and drinks that may make your symptoms worse:  Fatty foods, such as french fries.  Foods that contain gluten, such as pasta and cereal.  Dairy products, such as milk, cheese, and ice cream.  Chocolate.  Alcohol.  Products with caffeine, such as coffee.  Carbonated drinks, such as soda.  Foods that are high in FODMAPs. These include certain fruits and vegetables.  Products with sweeteners such as honey, high fructose corn syrup, sorbitol, and mannitol. The items listed above may not be a complete list of foods and beverages you should avoid. Contact a dietitian for more information. What foods are good sources of fiber? Your health care provider or dietitian may recommend that you eat more foods that contain fiber. Fiber can help to reduce constipation and other IBS symptoms. Add foods with fiber to your diet a little at a time so your body can get used to them. Too much fiber at one time might cause gas and swelling of your abdomen. The following are some foods that are good sources of fiber:  Berries, such as raspberries, strawberries, and blueberries.  Tomatoes.  Carrots.  Brown rice.  Oats.  Seeds, such as chia and pumpkin seeds. The items listed above may not be a complete list of recommended sources of fiber. Contact your dietitian for more options. Where to find more information  International Foundation for Functional Gastrointestinal Disorders: www.iffgd.[employer] of Diabetes and Digestive and Kidney Diseases: [employer]tn Summary  When you have irritable bowel syndrome (IBS), it is very important to eat the foods and follow the eating habits that are best for your condition.  IBS may cause various symptoms such as pain in the abdomen, constipation, or diarrhea.  Choosing the  right foods can help to ease the discomfort that comes from symptoms.  Keep a food diary. This will help you identify foods that cause symptoms.  Your health care provider or diet and nutrition specialist (dietitian) may recommend that you eat more foods that contain fiber. This information is not intended to replace advice given to you by your health care provider. Make sure you discuss any questions you have with your health care provider. Document Released: 06/06/2003 Document Revised: 10/11/2017 Document Reviewed: 11/17/2016 Elsevier Interactive Patient Education  2019 Elsevier Inc.  High-Fiber Diet Fiber, also called dietary fiber, is a type of carbohydrate that is found in fruits, vegetables, whole grains, and beans. A high-fiber diet can have many health benefits. Your health care provider may recommend a high-fiber diet to help:  Prevent constipation. Fiber can make your bowel movements more regular.  Lower your cholesterol.  Relieve the following conditions: ? Swelling of veins in the anus (hemorrhoids). ? Swelling and irritation (inflammation) of specific areas of the digestive tract (uncomplicated diverticulosis). ? A problem of the large intestine (colon) that sometimes causes pain and diarrhea (irritable bowel syndrome, IBS).  Prevent overeating as part  of a weight-loss plan.  Prevent heart disease, type 2 diabetes, and certain cancers. What is my plan? The recommended daily fiber intake in grams (g) includes:  38 g for men age 29 or younger.  30 g for men over age 29.  25 g for women age 29 or younger.  21 g for women over age 29. You can get the recommended daily intake of dietary fiber by:  Eating a variety of fruits, vegetables, grains, and beans.  Taking a fiber supplement, if it is not possible to get enough fiber through your diet. What do I need to know about a high-fiber diet?  It is better to get fiber through food sources rather than from fiber  supplements. There is not a lot of research about how effective supplements are.  Always check the fiber content on the nutrition facts label of any prepackaged food. Look for foods that contain 5 g of fiber or more per serving.  Talk with a diet and nutrition specialist (dietitian) if you have questions about specific foods that are recommended or not recommended for your medical condition, especially if those foods are not listed below.  Gradually increase how much fiber you consume. If you increase your intake of dietary fiber too quickly, you may have bloating, cramping, or gas.  Drink plenty of water. Water helps you to digest fiber. What are tips for following this plan?  Eat a wide variety of high-fiber foods.  Make sure that half of the grains that you eat each day are whole grains.  Eat breads and cereals that are made with whole-grain flour instead of refined flour or white flour.  Eat brown rice, bulgur wheat, or millet instead of white rice.  Start the day with a breakfast that is high in fiber, such as a cereal that contains 5 g of fiber or more per serving.  Use beans in place of meat in soups, salads, and pasta dishes.  Eat high-fiber snacks, such as berries, raw vegetables, nuts, and popcorn.  Choose whole fruits and vegetables instead of processed forms like juice or sauce. What foods can I eat?  Fruits Berries. Pears. Apples. Oranges. Avocado. Prunes and raisins. Dried figs. Vegetables Sweet potatoes. Spinach. Kale. Artichokes. Cabbage. Broccoli. Cauliflower. Green peas. Carrots. Squash. Grains Whole-grain breads. Multigrain cereal. Oats and oatmeal. Brown rice. Barley. Bulgur wheat. Millet. Quinoa. Bran muffins. Popcorn. Rye wafer crackers. Meats and other proteins Navy, kidney, and pinto beans. Soybeans. Split peas. Lentils. Nuts and seeds. Dairy Fiber-fortified yogurt. Beverages Fiber-fortified soy milk. Fiber-fortified orange juice. Other foods Fiber  bars. The items listed above may not be a complete list of recommended foods and beverages. Contact a dietitian for more options. What foods are not recommended? Fruits Fruit juice. Cooked, strained fruit. Vegetables Fried potatoes. Canned vegetables. Well-cooked vegetables. Grains White bread. Pasta made with refined flour. White rice. Meats and other proteins Fatty cuts of meat. Fried chicken or fried fish. Dairy Milk. Yogurt. Cream cheese. Sour cream. Fats and oils Butters. Beverages Soft drinks. Other foods Cakes and pastries. The items listed above may not be a complete list of foods and beverages to avoid. Contact a dietitian for more information. Summary  Fiber is a type of carbohydrate. It is found in fruits, vegetables, whole grains, and beans.  There are many health benefits of eating a high-fiber diet, such as preventing constipation, lowering blood cholesterol, helping with weight loss, and reducing your risk of heart disease, diabetes, and certain cancers.  Gradually increase  your intake of fiber. Increasing too fast can result in cramping, bloating, and gas. Drink plenty of water while you increase your fiber.  The best sources of fiber include whole fruits and vegetables, whole grains, nuts, seeds, and beans. This information is not intended to replace advice given to you by your health care provider. Make sure you discuss any questions you have with your health care provider. Document Released: 03/16/2005 Document Revised: 01/18/2017 Document Reviewed: 01/18/2017 Elsevier Interactive Patient Education  2019 [employer].

## 2018-04-19 ENCOUNTER — Ambulatory Visit (INDEPENDENT_AMBULATORY_CARE_PROVIDER_SITE_OTHER): Payer: 59 | Admitting: Family Medicine

## 2018-04-19 ENCOUNTER — Encounter: Payer: Self-pay | Admitting: Family Medicine

## 2018-04-19 VITALS — BP 98/78 | HR 80 | Temp 98.0°F | Wt 182.0 lb

## 2018-04-19 DIAGNOSIS — Z113 Encounter for screening for infections with a predominantly sexual mode of transmission: Secondary | ICD-10-CM

## 2018-04-19 DIAGNOSIS — Z131 Encounter for screening for diabetes mellitus: Secondary | ICD-10-CM | POA: Diagnosis not present

## 2018-04-19 DIAGNOSIS — Z0000 Encounter for general adult medical examination without abnormal findings: Secondary | ICD-10-CM

## 2018-04-19 DIAGNOSIS — Z13220 Encounter for screening for lipoid disorders: Secondary | ICD-10-CM

## 2018-04-19 LAB — CBC WITH DIFFERENTIAL/PLATELET
BASOS PCT: 0.7 % (ref 0.0–3.0)
Basophils Absolute: 0 10*3/uL (ref 0.0–0.1)
Eosinophils Absolute: 0 10*3/uL (ref 0.0–0.7)
Eosinophils Relative: 0.4 % (ref 0.0–5.0)
HEMATOCRIT: 39.9 % (ref 36.0–46.0)
Hemoglobin: 13.2 g/dL (ref 12.0–15.0)
LYMPHS ABS: 2.2 10*3/uL (ref 0.7–4.0)
Lymphocytes Relative: 55.4 % — ABNORMAL HIGH (ref 12.0–46.0)
MCHC: 33.1 g/dL (ref 30.0–36.0)
MCV: 84.5 fl (ref 78.0–100.0)
MONOS PCT: 5.5 % (ref 3.0–12.0)
Monocytes Absolute: 0.2 10*3/uL (ref 0.1–1.0)
NEUTROS PCT: 38 % — AB (ref 43.0–77.0)
Neutro Abs: 1.5 10*3/uL (ref 1.4–7.7)
PLATELETS: 338 10*3/uL (ref 150.0–400.0)
RBC: 4.72 Mil/uL (ref 3.87–5.11)
RDW: 13.7 % (ref 11.5–15.5)
WBC: 4 10*3/uL (ref 4.0–10.5)

## 2018-04-19 LAB — BASIC METABOLIC PANEL
BUN: 11 mg/dL (ref 6–23)
CHLORIDE: 106 meq/L (ref 96–112)
CO2: 28 mEq/L (ref 19–32)
Calcium: 9.6 mg/dL (ref 8.4–10.5)
Creatinine, Ser: 0.68 mg/dL (ref 0.40–1.20)
GFR: 124.28 mL/min (ref >60.00)
Glucose, Bld: 72 mg/dL (ref 70–99)
POTASSIUM: 3.9 meq/L (ref 3.5–5.1)
SODIUM: 142 meq/L (ref 135–145)

## 2018-04-19 LAB — LIPID PANEL
CHOL/HDL RATIO: 4
Cholesterol: 168 mg/dL (ref 0–200)
HDL: 43.3 mg/dL (ref >39.00)
LDL CALC: 108 mg/dL — AB (ref 0–99)
NONHDL: 124.56
Triglycerides: 81 mg/dL (ref 0.0–149.0)
VLDL: 16.2 mg/dL (ref 0.0–40.0)

## 2018-04-19 LAB — HEMOGLOBIN A1C: Hgb A1c MFr Bld: 5.6 % (ref 4.6–6.5)

## 2018-04-19 NOTE — Patient Instructions (Signed)
Preventive Care 18-39 Years, Female Preventive care refers to lifestyle choices and visits with your health care provider that can promote health and wellness. What does preventive care include?   A yearly physical exam. This is also called an annual well check.  Dental exams once or twice a year.  Routine eye exams. Ask your health care provider how often you should have your eyes checked.  Personal lifestyle choices, including: ? Daily care of your teeth and gums. ? Regular physical activity. ? Eating a healthy diet. ? Avoiding tobacco and drug use. ? Limiting alcohol use. ? Practicing safe sex. ? Taking vitamin and mineral supplements as recommended by your health care provider. What happens during an annual well check? The services and screenings done by your health care provider during your annual well check will depend on your age, overall health, lifestyle risk factors, and family history of disease. Counseling Your health care provider may ask you questions about your:  Alcohol use.  Tobacco use.  Drug use.  Emotional well-being.  Home and relationship well-being.  Sexual activity.  Eating habits.  Work and work environment.  Method of birth control.  Menstrual cycle.  Pregnancy history. Screening You may have the following tests or measurements:  Height, weight, and BMI.  Diabetes screening. This is done by checking your blood sugar (glucose) after you have not eaten for a while (fasting).  Blood pressure.  Lipid and cholesterol levels. These may be checked every 5 years starting at age 20.  Skin check.  Hepatitis C blood test.  Hepatitis B blood test.  Sexually transmitted disease (STD) testing.  BRCA-related cancer screening. This may be done if you have a family history of breast, ovarian, tubal, or peritoneal cancers.  Pelvic exam and Pap test. This may be done every 3 years starting at age 21. Starting at age 30, this may be done every 5  years if you have a Pap test in combination with an HPV test. Discuss your test results, treatment options, and if necessary, the need for more tests with your health care provider. Vaccines Your health care provider may recommend certain vaccines, such as:  Influenza vaccine. This is recommended every year.  Tetanus, diphtheria, and acellular pertussis (Tdap, Td) vaccine. You may need a Td booster every 10 years.  Varicella vaccine. You may need this if you have not been vaccinated.  HPV vaccine. If you are 26 or younger, you may need three doses over 6 months.  Measles, mumps, and rubella (MMR) vaccine. You may need at least one dose of MMR. You may also need a second dose.  Pneumococcal 13-valent conjugate (PCV13) vaccine. You may need this if you have certain conditions and were not previously vaccinated.  Pneumococcal polysaccharide (PPSV23) vaccine. You may need one or two doses if you smoke cigarettes or if you have certain conditions.  Meningococcal vaccine. One dose is recommended if you are age 19-21 years and a first-year college student living in a residence hall, or if you have one of several medical conditions. You may also need additional booster doses.  Hepatitis A vaccine. You may need this if you have certain conditions or if you travel or work in places where you may be exposed to hepatitis A.  Hepatitis B vaccine. You may need this if you have certain conditions or if you travel or work in places where you may be exposed to hepatitis B.  Haemophilus influenzae type b (Hib) vaccine. You may need this if you   have certain risk factors. Talk to your health care provider about which screenings and vaccines you need and how often you need them. This information is not intended to replace advice given to you by your health care provider. Make sure you discuss any questions you have with your health care provider. Document Released: 05/12/2001 Document Revised: 10/27/2016  Document Reviewed: 01/15/2015 Elsevier Interactive Patient Education  2019 [employer].

## 2018-04-19 NOTE — Progress Notes (Signed)
Subjective:     Amanda Kerr is a 28 y.o. female and is here for a comprehensive physical exam. The patient reports no problems.  Pt notes improvement in constipation since follow tips provided at last OFV.  Pt is on her menses, would like to return for pap.  Pt is fasting this visit.  Social History   Socioeconomic History  . Marital status: Single    Spouse name: Not on file  . Number of children: Not on file  . Years of education: Not on file  . Highest education level: Not on file  Occupational History  . Not on file  Social Needs  . Financial resource strain: Not on file  . Food insecurity:    Worry: Not on file    Inability: Not on file  . Transportation needs:    Medical: Not on file    Non-medical: Not on file  Tobacco Use  . Smoking status: Never Smoker  . Smokeless tobacco: Never Used  Substance and Sexual Activity  . Alcohol use: Yes    Alcohol/week: 1.0 standard drinks    Types: 1 Glasses of wine per week    Frequency: Never    Comment: occasional  . Drug use: No  . Sexual activity: Yes    Birth control/protection: None  Lifestyle  . Physical activity:    Days per week: Not on file    Minutes per session: Not on file  . Stress: Not on file  Relationships  . Social connections:    Talks on phone: Not on file    Gets together: Not on file    Attends religious service: Not on file    Active member of club or organization: Not on file    Attends meetings of clubs or organizations: Not on file    Relationship status: Not on file  . Intimate partner violence:    Fear of current or ex partner: Not on file    Emotionally abused: Not on file    Physically abused: Not on file    Forced sexual activity: Not on file  Other Topics Concern  . Not on file  Social History Narrative  . Not on file   Health Maintenance  Topic Date Due  . HIV Screening  11/13/2004  . TETANUS/TDAP  11/13/2008  . PAP-Cervical Cytology Screening  11/14/2010  . PAP  SMEAR-Modifier  10/25/2017  . INFLUENZA VACCINE  10/28/2017    The following portions of the patient's history were reviewed and updated as appropriate: allergies, current medications, past family history, past medical history, past social history, past surgical history and problem list.  Review of Systems A comprehensive review of systems was negative.   Objective:    BP 98/78 (BP Location: Left Arm, Patient Position: Sitting, Cuff Size: Normal)   Pulse 80   Temp 98 F (36.7 C) (Oral)   Wt 182 lb (82.6 kg)   LMP 04/19/2018   SpO2 97%   BMI 28.51 kg/m  General appearance: alert, cooperative and no distress Head: Normocephalic, without obvious abnormality, atraumatic Eyes: conjunctivae/corneas clear. PERRL, EOM's intact. Fundi benign. Ears: normal TM's and external ear canals both ears Nose: Nares normal. Septum midline. Mucosa normal. No drainage or sinus tenderness. Throat: lips, mucosa, and tongue normal; teeth and gums normal Neck: no adenopathy, no carotid bruit, no JVD, supple, symmetrical, trachea midline and thyroid not enlarged, symmetric, no tenderness/mass/nodules Lungs: clear to auscultation bilaterally Heart: regular rate and rhythm, S1, S2 normal, no murmur, click,  rub or gallop Abdomen: soft, non-tender; bowel sounds normal; no masses,  no organomegaly Extremities: extremities normal, atraumatic, no cyanosis or edema Skin: Skin color, texture, turgor normal. No rashes or lesions Neurologic: Alert and oriented X 3, normal strength and tone. Normal symmetric reflexes. Normal coordination and gait    Assessment:    Healthy female exam.      Plan:     Anticipatory guidance given including wearing seatbelts, smoke detectors in the home, increasing physical activity, increasing p.o. intake of water and vegetables. -will obtain labs including STI testing. -pt to return for pap.  Will obtain GC testing at that time. -immnunizations--pt to check with her insurance  company about Tdap.  Declines influenza at this time. -given handout -next CPE in 1 yr. See After Visit Summary for Counseling Recommendations    Abbe Amsterdam, MD

## 2018-04-20 LAB — RPR TITER: RPR Titer: 1:1 {titer} — ABNORMAL HIGH

## 2018-04-20 LAB — FLUORESCENT TREPONEMAL AB(FTA)-IGG-BLD: FLUORESCENT TREPONEMAL ABS: NONREACTIVE

## 2018-04-20 LAB — RPR: RPR: REACTIVE — AB

## 2018-04-20 LAB — HIV ANTIBODY (ROUTINE TESTING W REFLEX): HIV: NONREACTIVE

## 2018-05-09 ENCOUNTER — Ambulatory Visit (INDEPENDENT_AMBULATORY_CARE_PROVIDER_SITE_OTHER): Payer: 59 | Admitting: Family Medicine

## 2018-05-09 ENCOUNTER — Encounter: Payer: Self-pay | Admitting: Family Medicine

## 2018-05-09 ENCOUNTER — Other Ambulatory Visit (HOSPITAL_COMMUNITY)
Admission: RE | Admit: 2018-05-09 | Discharge: 2018-05-09 | Disposition: A | Discharge status: Home / Self Care | Payer: 59 | Source: Ambulatory Visit | Attending: Family Medicine | Admitting: Family Medicine

## 2018-05-09 VITALS — BP 120/80 | HR 96 | Temp 98.8°F | Ht 68.5 in | Wt 183.4 lb

## 2018-05-09 DIAGNOSIS — Z124 Encounter for screening for malignant neoplasm of cervix: Secondary | ICD-10-CM | POA: Insufficient documentation

## 2018-05-09 NOTE — Patient Instructions (Signed)
Preventing Cervical Cancer  Cervical cancer is cancer that grows on the cervix. The cervix is at the bottom of the uterus. It connects the uterus to the vagina. The uterus is where a baby develops during pregnancy. Cancer occurs when cells become abnormal and start to grow out of control. Cervical cancer grows slowly and may not cause any symptoms at first. Over time, the cancer can grow deep into the cervix tissue and spread to other areas. If it is found early, cervical cancer can be treated effectively. You can also take steps to prevent this type of cancer. Most cases of cervical cancer are caused by an STI (sexually transmitted infection) called human papillomavirus (HPV). One way to reduce your risk of cervical cancer is to avoid infection with the HPV virus. You can do this by practicing safe sex and by getting the HPV vaccine. Getting regular Pap tests is also important because this can help identify changes in cells that could lead to cancer. Your chances of getting this disease can also be reduced by making certain lifestyle changes. How can I protect myself from cervical cancer? Preventing HPV infection  Ask your health care provider about getting the HPV vaccine. If you are 26 years old or younger, you may need to get this vaccine, which is given in three doses over 6 months. This vaccine protects against the types of HPV that could cause cancer.  Limit the number of people you have sex with. Also avoid having sex with people who have had many sex partners.  Use a latex condom during sex. Getting Pap tests  Get Pap tests regularly, starting at age 21. Talk with your health care provider about how often you need these tests. ? Most women who are 21?29 years of age should have a Pap test every 3 years. ? Most women who are 30?29 years of age should have a Pap test in combination with an HPV test every 5 years. ? Women with a higher risk of cervical cancer, such as those with a weakened  immune system or those who have been exposed to the drug diethylstilbestrol (DES), may need more frequent testing. Making other lifestyle changes  Do not use any products that contain nicotine or tobacco, such as cigarettes and e-cigarettes. If you need help quitting, ask your health care provider.  Eat at least 5 servings of fruits and vegetables every day.  Lose weight if you are overweight. Why are these changes important?  These changes and screening tests are designed to address the factors that are known to increase the risk of cervical cancer. Taking these steps is the best way to reduce your risk.  Having regular Pap tests will help identify changes in cells that could lead to cancer. Steps can then be taken to prevent cancer from developing.  These changes will also help find cervical cancer early. This type of cancer can be treated effectively if it is found early. It can be more dangerous and difficult to treat if cancer has grown deep into your cervix or has spread.  In addition to making you less likely to get cervical cancer, these changes will also provide other health benefits, such as the following: ? Practicing safe sex is important for preventing STIs and unplanned pregnancies. ? Avoiding tobacco can reduce your risk for other cancers and health issues. ? Eating a healthy diet and maintaining a healthy weight are good for your overall health. What can happen if changes are not made? In   the early stages, cervical cancer might not have any symptoms. It can take many years for the cancer to grow and get deep into the cervix tissue. This may be happening without you knowing about it. If you develop any symptoms, such as pelvic pain or unusual discharge or bleeding from your vagina, you should see your health care provider right away. If cervical cancer is not found early, you might need treatments such as radiation, chemotherapy, or surgery. In some cases, surgery may mean that  you will not be able to get pregnant or carry a pregnancy to term. Where to find support Talk with your health care provider, school nurse, or local health department for guidance about screening and vaccination. Some children and teens may be able to get the HPV vaccine free of charge through the U.S. government's Vaccines for Children (VFC) program. Other places that provide vaccinations include:  Public health clinics. Check with your local health department.  Federally Qualified Health Centers, where you would pay only what you can afford. To find one near you, check this website: www.fqhc.org/find-an-fqhc/  [facility] Clinics. These are part of a program for Medicare and Medicaid patients who live in rural areas. The National Breast and Cervical Cancer Early Detection Program also provides breast and cervical cancer screenings and diagnostic services to low-income, uninsured, and underinsured women. Cervical cancer can be passed down through families. Talk with your health care provider or genetic counselor to learn more about genetic testing for cancer. Where to find more information Learn more about cervical cancer from:  American College of Gynecology: www.acog.org/Patients/FAQs/Cervical-Cancer  American Cancer Society: www.cancer.org/cancer/cervicalcancer/  U.S. Centers for Disease Control and Prevention: www.cdc.gov/cancer/cervical/ Summary  Talk with your health care provider about getting the HPV vaccine.  Be sure to get regular Pap tests as recommended by your health care provider.  See your health care provider right away if you have any pelvic pain or unusual discharge or bleeding from your vagina. This information is not intended to replace advice given to you by your health care provider. Make sure you discuss any questions you have with your health care provider. Document Released: 03/31/2015 Document Revised: 11/12/2015 Document Reviewed: 11/12/2015 Elsevier  Interactive Patient Education  2019 Elsevier Inc.  

## 2018-05-09 NOTE — Progress Notes (Addendum)
Subjective:    Patient ID: Amanda Kerr, female    DOB: Jun 23, 1990, 28 y.o.   MRN: [id number]  Chief Complaint  Patient presents with  . patient presents for pap smear (seen previously for annual)    HPI Patient was seen today for Pap.  Patient denies any current issues including vaginal discharge or irritation.  Patient was seen last week for CPE and labs.  LMP 04/16/2018.  Past Medical History:  Diagnosis Date  . Asthma   . Depression   . Frequent headaches   . Medical history non-contributory   . UTI (urinary tract infection)     No Known Allergies  ROS General: Denies fever, chills, night sweats, changes in weight, changes in appetite HEENT: Denies headaches, ear pain, changes in vision, rhinorrhea, sore throat CV: Denies CP, palpitations, SOB, orthopnea Pulm: Denies SOB, cough, wheezing GI: Denies abdominal pain, nausea, vomiting, diarrhea, constipation GU: Denies dysuria, hematuria, frequency, vaginal discharge Msk: Denies muscle cramps, joint pains Neuro: Denies weakness, numbness, tingling Skin: Denies rashes, bruising Psych: Denies depression, anxiety, hallucinations    Objective:    Blood pressure 120/80, pulse 96, temperature 98.8 F (37.1 C), temperature source Oral, height 5' 8.5" (1.74 m), weight 183 lb 6.4 oz (83.2 kg), last menstrual period 04/16/2018.  Gen. Pleasant, well-nourished, in no distress, normal affect   HEENT: [place]/AT, face symmetric, conjunctiva clear, no scleral icterus, PERRLA, nares patent without drainage Lungs: no accessory muscle use Cardiovascular: RRR, no peripheral edema GU: Normal external female genitalia, no lesions, normal vaginal rugae, scant whitish discharge noted, no lesions on cervix, small amount of bright red blood noted at os, No CMT, no masses. chaperone present: J.  Funderburk, CMA Neuro:  A&Ox3, CN II-XII intact, normal gait   Wt Readings from Last 3 Encounters:  05/09/18 183 lb 6.4 oz (83.2 kg)  04/19/18 182 lb  (82.6 kg)  04/13/18 186 lb (84.4 kg)    Lab Results  Component Value Date   WBC 4.0 04/19/2018   HGB 13.2 04/19/2018   HCT 39.9 04/19/2018   PLT 338.0 04/19/2018   GLUCOSE 72 04/19/2018   CHOL 168 04/19/2018   TRIG 81.0 04/19/2018   HDL 43.30 04/19/2018   LDLCALC 108 (H) 04/19/2018   NA 142 04/19/2018   K 3.9 04/19/2018   CL 106 04/19/2018   CREATININE 0.68 04/19/2018   BUN 11 04/19/2018   CO2 28 04/19/2018   HGBA1C 5.6 04/19/2018    Assessment/Plan:  Encounter for Papanicolaou smear of cervix  -We will also obtain testing for  trichomoniasis, GC, BV, yeast. - Plan: PAP [[place]]   Follow-up PRN  Abbe Amsterdam, MD

## 2018-05-11 LAB — CYTOLOGY - PAP
Bacterial vaginitis: POSITIVE — AB
Candida vaginitis: NEGATIVE
Chlamydia: NEGATIVE
Diagnosis: NEGATIVE
NEISSERIA GONORRHEA: NEGATIVE
Trichomonas: NEGATIVE

## 2018-05-13 ENCOUNTER — Other Ambulatory Visit: Payer: Self-pay | Admitting: Family Medicine

## 2018-05-13 DIAGNOSIS — N760 Acute vaginitis: Principal | ICD-10-CM

## 2018-05-13 DIAGNOSIS — B9689 Other specified bacterial agents as the cause of diseases classified elsewhere: Secondary | ICD-10-CM

## 2018-05-13 MED ORDER — METRONIDAZOLE 500 MG PO TABS: 500.0000 mg | ORAL_TABLET | Freq: Two times a day (BID) | ORAL | 0 refills | Status: AC

## 2018-06-06 ENCOUNTER — Ambulatory Visit: Payer: Self-pay | Admitting: *Deleted

## 2018-06-06 ENCOUNTER — Encounter: Payer: Self-pay | Admitting: Family Medicine

## 2018-06-06 NOTE — Telephone Encounter (Signed)
Summary: home care advice    Pt has been having itchy pelvic pain for 3 days. Pt would like home care advice.  Pt is having discharge. [phone]     Patient is giving classic yeast symptoms- she will use over the counter treatment and she will call back if she does not get better. Reason for Disposition . Symptoms of a vaginal yeast infection (i.e., white, thick, cottage-cheese-like, itchy, not bad smelling discharge)  Answer Assessment - Initial Assessment Questions 1. DISCHARGE: "Describe the discharge." (e.g., white, yellow, green, gray, foamy, cottage cheese-like)     Cottage cheese type discharge,white 2. ODOR: "Is there a bad odor?"     no 3. ONSET: "When did the discharge begin?"     yesterday 4. RASH: "Is there a rash in that area?" If so, ask: "Describe it." (e.g., redness, blisters, sores, bumps)     no 5. ABDOMINAL PAIN: "Are you having any abdominal pain?" If yes: "What does it feel like? " (e.g., crampy, dull, intermittent, constant)      no 6. ABDOMINAL PAIN SEVERITY: If present, ask: "How bad is it?"  (e.g., mild, moderate, severe)  - MILD - doesn't interfere with normal activities   - MODERATE - interferes with normal activities or awakens from sleep   - SEVERE - patient doesn't want to move (R/O peritonitis)      n/a 7. CAUSE: "What do you think is causing the discharge?" "Have you had the same problem before? What happened then?"     BV hx- ? 8. OTHER SYMPTOMS: "Do you have any other symptoms?" (e.g., fever, itching, vaginal bleeding, pain with urination, injury to genital area, vaginal foreign body)     itching 9. PREGNANCY: "Is there any chance you are pregnant?" "When was your last menstrual period?"     No- LMP- due next week  Protocols used: VAGINAL DISCHARGE-A-AH

## 2018-09-19 ENCOUNTER — Other Ambulatory Visit: Payer: Self-pay

## 2018-09-19 ENCOUNTER — Encounter: Payer: Self-pay | Admitting: Family Medicine

## 2018-09-19 ENCOUNTER — Ambulatory Visit: Payer: 59 | Admitting: Family Medicine

## 2018-09-19 DIAGNOSIS — N930 Postcoital and contact bleeding: Secondary | ICD-10-CM

## 2018-09-19 DIAGNOSIS — B009 Herpesviral infection, unspecified: Secondary | ICD-10-CM

## 2018-09-19 HISTORY — DX: Herpesviral infection, unspecified: B00.9

## 2018-09-19 NOTE — Progress Notes (Signed)
Virtual Visit via Video Note  I connected with Amanda Kerr on 09/19/18 at  9:30 AM EDT by a video enabled telemedicine application and verified that I am speaking with the correct person using two identifiers.  Location patient: home Location provider:work or home office Persons participating in the virtual visit: patient, provider  I discussed the limitations of evaluation and management by telemedicine and the availability of in person appointments. The patient expressed understanding and agreed to proceed.   HPI: Pt having post coital bleeding x 3 wks.  LMP 6/3-7.  Pt endorses regular menses. The bleeding started as spotting, then increased in amount during subsequent episodes.   Pt states the blood has been on the toilet tissue, not in the toilet.  Denies vaginal d/c, dysparunia, urinary urgency, urinary frequency, fever.  Last pap was 05/09/18.   ROS: See pertinent positives and negatives per HPI.  Past Medical History:  Diagnosis Date  . Asthma   . Depression   . Frequent headaches   . Medical history non-contributory   . UTI (urinary tract infection)     Past Surgical History:  Procedure Laterality Date  . NO PAST SURGERIES      Family History  Problem Relation Age of Onset  . Hypertension Mother   . Diabetes Maternal Grandmother   . Glaucoma Maternal Grandmother   . Heart disease Maternal Grandmother   . Hypertension Father   . Hyperlipidemia Father   . Asthma Sister   . Heart disease Paternal Grandmother   . Cancer Paternal Grandfather      Current Outpatient Medications:  .  valACYclovir (VALTREX) 500 MG tablet, Take 500 mg by mouth daily as needed (outbreak)., Disp: , Rfl:   EXAM:  VITALS per patient if applicable:  RR between 12-20 bpm  GENERAL: alert, oriented, appears well and in no acute distress  HEENT: atraumatic, conjunctiva clear, no obvious abnormalities on inspection of external nose and ears  NECK: normal movements of the head and  neck  LUNGS: on inspection no signs of respiratory distress, breathing rate appears normal, no obvious gross SOB, gasping or wheezing  CV: no obvious cyanosis  MS: moves all visible extremities without noticeable abnormality  PSYCH/NEURO: pleasant and cooperative, no obvious depression or anxiety, speech and thought processing grossly intact  ASSESSMENT AND PLAN:  Discussed the following assessment and plan:  PCB (post coital bleeding) -pap 05/09/18 normal, BV noted and treated. -discussed various causes including STIs, tears/abrasions, vaginal dryness, cervical changes, UTI, etc -will have pt come to clinic this wk for STI check and wet prep.    I discussed the assessment and treatment plan with the patient. The patient was provided an opportunity to ask questions and all were answered. The patient agreed with the plan and demonstrated an understanding of the instructions.   The patient was advised to call back or seek an in-person evaluation if the symptoms worsen or if the condition fails to improve as anticipated.   Billie Ruddy, MD

## 2018-09-21 ENCOUNTER — Ambulatory Visit (INDEPENDENT_AMBULATORY_CARE_PROVIDER_SITE_OTHER): Payer: 59 | Admitting: Family Medicine

## 2018-09-21 ENCOUNTER — Other Ambulatory Visit: Payer: Self-pay

## 2018-09-21 ENCOUNTER — Other Ambulatory Visit (HOSPITAL_COMMUNITY)
Admission: RE | Admit: 2018-09-21 | Discharge: 2018-09-21 | Disposition: A | Discharge status: Home / Self Care | Payer: 59 | Source: Ambulatory Visit | Attending: Family Medicine | Admitting: Family Medicine

## 2018-09-21 ENCOUNTER — Encounter: Payer: Self-pay | Admitting: Family Medicine

## 2018-09-21 VITALS — BP 120/78 | HR 94 | Temp 98.4°F | Wt 191.0 lb

## 2018-09-21 DIAGNOSIS — N930 Postcoital and contact bleeding: Secondary | ICD-10-CM | POA: Diagnosis not present

## 2018-09-21 LAB — POCT URINE PREGNANCY: Preg Test, Ur: NEGATIVE

## 2018-09-21 NOTE — Progress Notes (Signed)
Subjective:    Patient ID: Amanda Kerr, female    DOB: 1990-06-08, 28 y.o.   MRN: [id number]  No chief complaint on file.   HPI Patient was seen today for acute visit/follow-up on postcoital bleeding.  Patient endorses postcoital bleeding x3 weeks.  Noticed spotting on toilet tissue which is slowly increased in amount.  LMP 6/3-7.  Last Pap 04/2018.  Patient denies urinary symptoms, vaginal discharge, abdominal pain, nausea, vomiting.  Past Medical History:  Diagnosis Date  . Asthma   . Depression   . Frequent headaches   . Medical history non-contributory   . UTI (urinary tract infection)     No Known Allergies  ROS General: Denies fever, chills, night sweats, changes in weight, changes in appetite HEENT: Denies headaches, ear pain, changes in vision, rhinorrhea, sore throat CV: Denies CP, palpitations, SOB, orthopnea Pulm: Denies SOB, cough, wheezing GI: Denies abdominal pain, nausea, vomiting, diarrhea, constipation GU: Denies dysuria, hematuria, frequency, vaginal discharge  + postcoital bleeding Msk: Denies muscle cramps, joint pains Neuro: Denies weakness, numbness, tingling Skin: Denies rashes, bruising Psych: Denies depression, anxiety, hallucinations     Objective:    Blood pressure 120/78, pulse 94, temperature 98.4 F (36.9 C), temperature source Oral, weight 191 lb (86.6 kg), last menstrual period 08/31/2018, SpO2 98 %.  Gen. Pleasant, well-nourished, in no distress, normal affect   HEENT: [place]/AT, face symmetric, conjunctiva clear, no scleral icterus,  nares patent without drainage Lungs: no accessory muscle use, CTAB, no wheezes or rales Cardiovascular: RRR, no peripheral edema Abdomen: BS present, soft, NT/ND, no hepatosplenomegaly. GU: Pelvic examination with specimen collection.  Normal external genitalia without hair distribution or lesions. Urethral meatus and Urethra normal in appearance. Vagina normal, no d/c or bleeding present.  No erythema,  normal vaginal rugae, no lesions, no atrophy.  Anus and perineum normal in appearance, no hemorrhoids present. Neuro:  A&Ox3, CN II-XII intact, normal gait Skin:  Warm, no lesions/ rash   Wt Readings from Last 3 Encounters:  05/09/18 183 lb 6.4 oz (83.2 kg)  04/19/18 182 lb (82.6 kg)  04/13/18 186 lb (84.4 kg)    Lab Results  Component Value Date   WBC 4.0 04/19/2018   HGB 13.2 04/19/2018   HCT 39.9 04/19/2018   PLT 338.0 04/19/2018   GLUCOSE 72 04/19/2018   CHOL 168 04/19/2018   TRIG 81.0 04/19/2018   HDL 43.30 04/19/2018   LDLCALC 108 (H) 04/19/2018   NA 142 04/19/2018   K 3.9 04/19/2018   CL 106 04/19/2018   CREATININE 0.68 04/19/2018   BUN 11 04/19/2018   CO2 28 04/19/2018   HGBA1C 5.6 04/19/2018    Assessment/Plan:  PCB (post coital bleeding)  -discussed possible causes including infection, lesions, fibroids, pregnancy etc -pap 04/2018 normal -given handout -will evalutate for GC, yeast, trich, BV with vaginal swab.   -Plan: POCT urine pregnancy, Cervicovaginal ancillary only  F/u prn  Grier Mitts, MD

## 2018-09-21 NOTE — Patient Instructions (Addendum)
Abnormal Uterine Bleeding  Abnormal uterine bleeding is unusual bleeding from the uterus. It includes:   Bleeding or spotting between periods.   Bleeding after sex.   Bleeding that is heavier than normal.   Periods that last longer than usual.   Bleeding after menopause.  Abnormal uterine bleeding can affect women at various stages in life, including teenagers, women in their reproductive years, pregnant women, and women who have reached menopause. Common causes of abnormal uterine bleeding include:   Pregnancy.   Growths of tissue (polyps).   A noncancerous tumor in the uterus (fibroid).   Infection.   Cancer.   Hormonal imbalances.  Any type of abnormal bleeding should be evaluated by a health care provider. Many cases are minor and simple to treat, while others are more serious. Treatment will depend on the cause of the bleeding.  Follow these instructions at home:   Monitor your condition for any changes.   Do not use tampons, douche, or have sex if told by your health care provider.   Change your pads often.   Get regular exams that include pelvic exams and cervical cancer screening.   Keep all follow-up visits as told by your health care provider. This is important.  Contact a health care provider if:   Your bleeding lasts for more than one week.   You feel dizzy at times.   You feel nauseous or you vomit.  Get help right away if:   You pass out.   Your bleeding soaks through a pad every hour.   You have abdominal pain.   You have a fever.   You become sweaty or weak.   You pass large blood clots from your vagina.  Summary   Abnormal uterine bleeding is unusual bleeding from the uterus.   Any type of abnormal bleeding should be evaluated by a health care provider. Many cases are minor and simple to treat, while others are more serious.   Treatment will depend on the cause of the bleeding.  This information is not intended to replace advice given to you by your health care provider.  Make sure you discuss any questions you have with your health care provider.  Document Released: 03/16/2005 Document Revised: 04/17/2016 Document Reviewed: 04/17/2016  Elsevier Interactive Patient Education  2019 Elsevier Inc.

## 2018-09-22 LAB — CERVICOVAGINAL ANCILLARY ONLY
Bacterial vaginitis: POSITIVE — AB
Candida vaginitis: NEGATIVE
Chlamydia: NEGATIVE
Neisseria Gonorrhea: NEGATIVE
Trichomonas: NEGATIVE

## 2018-09-23 ENCOUNTER — Other Ambulatory Visit: Payer: Self-pay | Admitting: Family Medicine

## 2018-09-23 DIAGNOSIS — N760 Acute vaginitis: Secondary | ICD-10-CM

## 2018-09-23 DIAGNOSIS — B9689 Other specified bacterial agents as the cause of diseases classified elsewhere: Secondary | ICD-10-CM

## 2018-09-23 MED ORDER — METRONIDAZOLE 0.75 % VA GEL: 1.0000 | Freq: Every day | VAGINAL | 0 refills | Status: AC

## 2018-10-09 ENCOUNTER — Encounter: Payer: Self-pay | Admitting: Family Medicine

## 2018-10-12 ENCOUNTER — Encounter: Payer: Self-pay | Admitting: Family Medicine

## 2018-10-13 ENCOUNTER — Other Ambulatory Visit: Payer: Self-pay

## 2018-10-13 MED ORDER — VALACYCLOVIR HCL 500 MG PO TABS: 500.0000 mg | ORAL_TABLET | Freq: Every day | ORAL | 0 refills | Status: DC | PRN

## 2019-03-04 ENCOUNTER — Encounter (HOSPITAL_COMMUNITY): Payer: Self-pay | Admitting: *Deleted

## 2019-03-04 ENCOUNTER — Other Ambulatory Visit: Payer: Self-pay

## 2019-03-04 ENCOUNTER — Ambulatory Visit (HOSPITAL_COMMUNITY)
Admission: EM | Admit: 2019-03-04 | Discharge: 2019-03-04 | Disposition: A | Discharge status: Home / Self Care | Payer: 59 | Attending: Family Medicine | Admitting: Family Medicine

## 2019-03-04 DIAGNOSIS — R0789 Other chest pain: Secondary | ICD-10-CM

## 2019-03-04 NOTE — ED Triage Notes (Signed)
C/O intermittent light pains to chest x 1 wk; pain occurs in various places of chest.  Denies any dizziness, lightheadedness, SOB, nausea, diaphoresis associated with pain.  Pain unchanged by movement, palpation, or deep breathing.  Denies any pain at present time.

## 2019-03-04 NOTE — Discharge Instructions (Signed)
You have been seen at the [facility] today for chest pain. Your evaluation today was not suggestive of any emergent condition requiring medical intervention at this time. Your ECG (heart tracing) did not show any worrisome changes. However, some medical problems make take more time to appear. Therefore, it's very important that you pay attention to any new symptoms or worsening of your current condition.  Please proceed directly to the Emergency Department immediately should you feel worse in any way or have any of the following symptoms: increasing or different chest pain, pain that spreads to your arm, neck, jaw, back or abdomen, shortness of breath, or nausea and vomiting.  

## 2019-03-06 NOTE — ED Provider Notes (Signed)
[place]   [id number] 03/04/19 Arrival Time: 6213  ASSESSMENT & PLAN:  1. Atypical chest pain      Patient history and exam consistent with non-cardiac cause of chest pain. Reassured  Possibly GERD related. Refers trial of OTC acid reducer and observation. No indications for chest imaging.  Chest pain precautions given. Reviewed expectations re: course of current medical issues. Questions answered. Outlined signs and symptoms indicating need for more acute intervention. Patient verbalized understanding. After Visit Summary given.   SUBJECTIVE:  Triage: C/O intermittent light pains to chest x 1 wk; pain occurs in various places of chest.  Denies any dizziness, lightheadedness, SOB, nausea, diaphoresis associated with pain.  Pain unchanged by movement, palpation, or deep breathing.  Denies any pain at present time.  History from: patient. Amanda Kerr is a 28 y.o. female who presents with complaint of intermittent chest pains. First noted over this past week; sporadic and transient; few seconds then gone. Various places of chest. No associated SOB/n/v/diaphoreses. No specific aggravating or alleviating factors reported. Questions if sometimes related to PO intake. No injury/trauma. No new medications. 2/10 when present. "Feels like a sharp pain then it goes away." No h/o similar. Does not wake her at night. No OTC/home treatment. No recent illnesses or fevers.  Illicit drug use: none.  Social History   Tobacco Use  Smoking Status Never Smoker  Smokeless Tobacco Never Used   Social History   Substance and Sexual Activity  Alcohol Use Yes  . Frequency: Never   Comment: occasional    ROS: As per HPI. All other systems negative.   OBJECTIVE:  Vitals:   03/04/19 1421  BP: 138/88  Pulse: 97  Resp: 16  Temp: 98.8 F (37.1 C)  TempSrc: Oral  SpO2: 100%    General appearance: alert, oriented, no acute distress Eyes: PERRLA; EOMI; conjunctivae  normal HENT: normocephalic; atraumatic Neck: supple with FROM Lungs: without labored respirations; CTAB Heart: regular rate and rhythm without murmer Chest Wall: without tenderness to palpation Abdomen: soft, non-tender; bowel sounds normal; no masses or organomegaly; no guarding or rebound tenderness Extremities: without edema; without calf swelling or tenderness; symmetrical without gross deformities Skin: warm and dry; without rash or lesions Psychological: alert and cooperative; normal mood and affect    No Known Allergies  Past Medical History:  Diagnosis Date  . Asthma   . Depression   . Frequent headaches   . UTI (urinary tract infection)    Social History   Socioeconomic History  . Marital status: Single    Spouse name: Not on file  . Number of children: Not on file  . Years of education: Not on file  . Highest education level: Not on file  Occupational History  . Not on file  Social Needs  . Financial resource strain: Not on file  . Food insecurity    Worry: Not on file    Inability: Not on file  . Transportation needs    Medical: Not on file    Non-medical: Not on file  Tobacco Use  . Smoking status: Never Smoker  . Smokeless tobacco: Never Used  Substance and Sexual Activity  . Alcohol use: Yes    Frequency: Never    Comment: occasional  . Drug use: No  . Sexual activity: Yes    Birth control/protection: None  Lifestyle  . Physical activity    Days per week: Not on file    Minutes per session: Not on file  .  Stress: Not on file  Relationships  . Social Musician on phone: Not on file    Gets together: Not on file    Attends religious service: Not on file    Active member of club or organization: Not on file    Attends meetings of clubs or organizations: Not on file    Relationship status: Not on file  . Intimate partner violence    Fear of current or ex partner: Not on file    Emotionally abused: Not on file    Physically abused:  Not on file    Forced sexual activity: Not on file  Other Topics Concern  . Not on file  Social History Narrative  . Not on file   Family History  Problem Relation Age of Onset  . Hypertension Mother   . Diabetes Maternal Grandmother   . Glaucoma Maternal Grandmother   . Heart disease Maternal Grandmother   . Hypertension Father   . Hyperlipidemia Father   . Asthma Sister   . Heart disease Paternal Grandmother   . Cancer Paternal Grandfather    Past Surgical History:  Procedure Laterality Date  . NO PAST SURGERIES       Mardella Layman, MD 03/08/19 [phone]

## 2019-03-31 ENCOUNTER — Other Ambulatory Visit: Payer: Self-pay

## 2019-03-31 ENCOUNTER — Ambulatory Visit (HOSPITAL_COMMUNITY)
Admission: EM | Admit: 2019-03-31 | Discharge: 2019-03-31 | Disposition: A | Discharge status: Home / Self Care | Payer: 59 | Attending: Urgent Care | Admitting: Urgent Care

## 2019-03-31 ENCOUNTER — Encounter (HOSPITAL_COMMUNITY): Payer: Self-pay

## 2019-03-31 DIAGNOSIS — Z20822 Contact with and (suspected) exposure to covid-19: Secondary | ICD-10-CM | POA: Diagnosis not present

## 2019-03-31 DIAGNOSIS — J45909 Unspecified asthma, uncomplicated: Secondary | ICD-10-CM | POA: Diagnosis not present

## 2019-03-31 DIAGNOSIS — J020 Streptococcal pharyngitis: Secondary | ICD-10-CM | POA: Insufficient documentation

## 2019-03-31 DIAGNOSIS — J029 Acute pharyngitis, unspecified: Secondary | ICD-10-CM | POA: Diagnosis not present

## 2019-03-31 DIAGNOSIS — H9201 Otalgia, right ear: Secondary | ICD-10-CM | POA: Diagnosis not present

## 2019-03-31 LAB — POCT RAPID STREP A: Streptococcus, Group A Screen (Direct): POSITIVE — AB

## 2019-03-31 MED ORDER — AMOXICILLIN 500 MG PO CAPS: 500.0000 mg | ORAL_CAPSULE | Freq: Two times a day (BID) | ORAL | 0 refills | Status: DC

## 2019-03-31 MED ORDER — NAPROXEN 500 MG PO TABS: 500.0000 mg | ORAL_TABLET | Freq: Two times a day (BID) | ORAL | 0 refills | Status: DC

## 2019-03-31 NOTE — ED Triage Notes (Signed)
Pt presents to UC with sore throat, right ear pain x 2 days.

## 2019-03-31 NOTE — Discharge Instructions (Addendum)
For sore throat try using a honey-based tea. Use 3 teaspoons of honey with juice squeezed from half lemon. Place shaved pieces of ginger into 1/2-1 cup of water and warm over stove top. Then mix the ingredients and repeat every 4 hours as needed. 

## 2019-03-31 NOTE — ED Provider Notes (Signed)
[place]   MRN: [id number] DOB: 22-Aug-1990  Subjective:   Amanda Kerr is a 29 y.o. female presenting for 2-day history of acute onset worsening persistent moderate throat pain with difficulty swallowing, now having right ear pain. Denies direct COVID 19 contacts.  Has tried over-the-counter medications without relief.  Denies taking chronic medications.  No Known Allergies  Past Medical History:  Diagnosis Date  . Asthma   . Depression   . Frequent headaches   . UTI (urinary tract infection)      Past Surgical History:  Procedure Laterality Date  . NO PAST SURGERIES      Family History  Problem Relation Age of Onset  . Hypertension Mother   . Diabetes Maternal Grandmother   . Glaucoma Maternal Grandmother   . Heart disease Maternal Grandmother   . Hypertension Father   . Hyperlipidemia Father   . Asthma Sister   . Heart disease Paternal Grandmother   . Cancer Paternal Grandfather     Social History   Tobacco Use  . Smoking status: Never Smoker  . Smokeless tobacco: Never Used  Substance Use Topics  . Alcohol use: Yes    Comment: occasional  . Drug use: No    Review of Systems  Constitutional: Negative for fever and malaise/fatigue.  HENT: Positive for ear pain and sore throat. Negative for congestion and sinus pain.   Eyes: Negative for blurred vision, double vision, discharge and redness.  Respiratory: Negative for cough, hemoptysis, shortness of breath and wheezing.   Cardiovascular: Negative for chest pain.  Gastrointestinal: Negative for abdominal pain, diarrhea, nausea and vomiting.  Genitourinary: Negative for dysuria, flank pain and hematuria.  Musculoskeletal: Negative for myalgias.  Skin: Negative for rash.  Neurological: Negative for dizziness, weakness and headaches.  Psychiatric/Behavioral: Negative for depression and substance abuse.     Objective:   Vitals: BP 122/80 (BP Location: Right Arm)   Pulse 86   Temp  99.5 F (37.5 C) (Oral)   Resp 16   LMP  (Within Weeks) Comment: 3 weeks  SpO2 98%   Physical Exam Constitutional:      General: She is not in acute distress.    Appearance: She is well-developed. She is ill-appearing. She is not toxic-appearing or diaphoretic.  HENT:     Head: Normocephalic and atraumatic.     Right Ear: Tympanic membrane and ear canal normal. No drainage or tenderness. No middle ear effusion. Tympanic membrane is not erythematous.     Left Ear: Tympanic membrane and ear canal normal. No drainage or tenderness.  No middle ear effusion. Tympanic membrane is not erythematous.     Nose: No congestion or rhinorrhea.     Mouth/Throat:     Mouth: Mucous membranes are moist. No oral lesions.     Pharynx: Pharyngeal swelling and posterior oropharyngeal erythema present. No oropharyngeal exudate or uvula swelling.     Tonsils: No tonsillar exudate or tonsillar abscesses.  Eyes:     Extraocular Movements:     Right eye: Normal extraocular motion.     Left eye: Normal extraocular motion.     Conjunctiva/sclera: Conjunctivae normal.     Pupils: Pupils are equal, round, and reactive to light.  Cardiovascular:     Rate and Rhythm: Normal rate.  Pulmonary:     Effort: Pulmonary effort is normal.  Musculoskeletal:     Cervical back: Normal range of motion and neck supple.  Lymphadenopathy:     Cervical: No cervical adenopathy.  Skin:  General: Skin is warm and dry.  Neurological:     General: No focal deficit present.     Mental Status: She is alert and oriented to person, place, and time.  Psychiatric:        Mood and Affect: Mood normal.        Behavior: Behavior normal.     Results for orders placed or performed during the hospital encounter of 03/31/19 (from the past 24 hour(s))  POCT rapid strep A [facility])     Status: Abnormal   Collection Time: 03/31/19 11:49 AM  Result Value Ref Range   Streptococcus, Group A Screen (Direct) POSITIVE (A) NEGATIVE     Assessment and Plan :   1. Strep pharyngitis   2. Sore throat   3. Otalgia of right ear     Patient is to start amoxicillin for treatment of strep pharyngitis.  Recommended supportive care otherwise.  COVID-19 testing is pending. Counseled patient on potential for adverse effects with medications prescribed/recommended today, ER and return-to-clinic precautions discussed, patient verbalized understanding.    Wallis Bamberg, PA-C 03/31/19 1157

## 2019-04-02 LAB — NOVEL CORONAVIRUS, NAA (HOSP ORDER, SEND-OUT TO REF LAB; TAT 18-24 HRS): SARS-CoV-2, NAA: NOT DETECTED

## 2019-04-03 ENCOUNTER — Telehealth: Payer: 59 | Admitting: Family Medicine

## 2019-06-07 ENCOUNTER — Other Ambulatory Visit: Payer: Self-pay | Admitting: Family Medicine

## 2019-06-07 MED ORDER — VALACYCLOVIR HCL 500 MG PO TABS: 500.0000 mg | ORAL_TABLET | Freq: Every day | ORAL | 0 refills | Status: DC | PRN

## 2019-09-15 ENCOUNTER — Ambulatory Visit (INDEPENDENT_AMBULATORY_CARE_PROVIDER_SITE_OTHER): Payer: 59 | Admitting: *Deleted

## 2019-09-15 ENCOUNTER — Other Ambulatory Visit: Payer: Self-pay

## 2019-09-15 DIAGNOSIS — Z3200 Encounter for pregnancy test, result unknown: Secondary | ICD-10-CM

## 2019-09-15 DIAGNOSIS — Z3201 Encounter for pregnancy test, result positive: Secondary | ICD-10-CM

## 2019-09-15 LAB — POCT PREGNANCY, URINE
Preg Test, Ur: POSITIVE — AB
Preg Test, Ur: POSITIVE — AB

## 2019-09-15 NOTE — Progress Notes (Signed)
Patient was assessed and managed by nursing staff during this encounter. I have reviewed the chart and agree with the documentation and plan. I have also made any necessary editorial changes.  Rose Hippler, MD 09/15/2019 11:23 AM 

## 2019-09-15 NOTE — Progress Notes (Signed)
Shonika left a urine specimen and left office and left a message we can call her with results and leave a message if she doesn't answer. I called Makaiya and informed her that her test was positive and we do recommend she start care and that I would leave a detailed MYChart message . Also to call us if questions. Catie Chiao,RN

## 2019-10-20 ENCOUNTER — Other Ambulatory Visit: Payer: Self-pay

## 2019-10-20 ENCOUNTER — Encounter (HOSPITAL_COMMUNITY): Payer: Self-pay

## 2019-10-20 ENCOUNTER — Ambulatory Visit (HOSPITAL_COMMUNITY)
Admission: RE | Admit: 2019-10-20 | Discharge: 2019-10-20 | Disposition: A | Discharge status: Home / Self Care | Payer: 59 | Source: Ambulatory Visit | Attending: Family Medicine | Admitting: Family Medicine

## 2019-10-20 VITALS — BP 124/80 | HR 88 | Temp 97.8°F | Resp 16

## 2019-10-20 DIAGNOSIS — Z20822 Contact with and (suspected) exposure to covid-19: Secondary | ICD-10-CM | POA: Insufficient documentation

## 2019-10-20 NOTE — ED Triage Notes (Signed)
Patient reports she was notified that she was exposed to a covid positive person yesterday.

## 2019-10-20 NOTE — Discharge Instructions (Addendum)
Go home to rest Drink plenty of fluids Take Tylenol if needed for pain or fever You may take over-the-counter cough and cold medicines as needed You must quarantine at home until your test result is available You can check for your test result in MyChart  

## 2019-10-20 NOTE — ED Provider Notes (Signed)
[facility]    CSN: [id number] Arrival date & time: 10/20/19  1837      History   Chief Complaint Chief Complaint  Patient presents with  . Appointment  . covid test    HPI Amanda Kerr is a 29 y.o. female.   HPI  Patient is here for Covid test only She was notified that she was exposed to Covid positive person yesterday She states it was in close exposure but they were in the same room without masks She is pregnant and has concern for her pregnancy She has no symptoms  Past Medical History:  Diagnosis Date  . Asthma   . Depression   . Frequent headaches   . UTI (urinary tract infection)     Patient Active Problem List   Diagnosis Date Noted  . HSV (herpes simplex virus) 09/19/2018  . Anxiety and depression 11/09/2017  . History of asthma 11/09/2017  . Allodynia 11/09/2017    Past Surgical History:  Procedure Laterality Date  . NO PAST SURGERIES      OB History    Gravida  2   Para      Term      Preterm      AB  1   Living        SAB  1   TAB      Ectopic      Multiple      Live Births               Home Medications    Prior to Admission medications   Medication Sig Start Date End Date Taking? Authorizing Provider  amoxicillin (AMOXIL) 500 MG capsule Take 1 capsule (500 mg total) by mouth 2 (two) times daily. 03/31/19   Wallis Bamberg, PA-C  naproxen (NAPROSYN) 500 MG tablet Take 1 tablet (500 mg total) by mouth 2 (two) times daily. 03/31/19   Wallis Bamberg, PA-C  valACYclovir (VALTREX) 500 MG tablet Take 1 tablet (500 mg total) by mouth daily as needed (outbreak). 06/07/19   Deeann Saint, MD    Family History Family History  Problem Relation Age of Onset  . Hypertension Mother   . Diabetes Maternal Grandmother   . Glaucoma Maternal Grandmother   . Heart disease Maternal Grandmother   . Hypertension Father   . Hyperlipidemia Father   . Asthma Sister   . Heart disease Paternal Grandmother   . Cancer Paternal  Grandfather     Social History Social History   Tobacco Use  . Smoking status: Never Smoker  . Smokeless tobacco: Never Used  Vaping Use  . Vaping Use: Never used  Substance Use Topics  . Alcohol use: Yes    Comment: occasional  . Drug use: No     Allergies   Patient has no known allergies.   Review of Systems Review of Systems See HPI  Physical Exam Triage Vital Signs ED Triage Vitals  Enc Vitals Group     BP 10/20/19 1904 124/80     Pulse Rate 10/20/19 1904 88     Resp 10/20/19 1904 16     Temp 10/20/19 1904 97.8 F (36.6 C)     Temp src --      SpO2 10/20/19 1904 97 %     Weight --      Height --      Head Circumference --      Peak Flow --      Pain Score 10/20/19 1903  0     Pain Loc --      Pain Edu? --      Excl. in GC? --    No data found.  Updated Vital Signs BP 124/80   Pulse 88   Temp 97.8 F (36.6 C)   Resp 16   LMP 02/09/2019 (Approximate)   SpO2 97%      Physical Exam Constitutional:      General: She is not in acute distress.    Appearance: She is well-developed.  HENT:     Head: Normocephalic and atraumatic.  Eyes:     Conjunctiva/sclera: Conjunctivae normal.     Pupils: Pupils are equal, round, and reactive to light.  Cardiovascular:     Rate and Rhythm: Normal rate.  Pulmonary:     Effort: Pulmonary effort is normal. No respiratory distress.  Abdominal:     General: There is no distension.     Palpations: Abdomen is soft.     Comments: Gravid  Musculoskeletal:        General: Normal range of motion.     Cervical back: Normal range of motion.  Skin:    General: Skin is warm and dry.  Neurological:     Mental Status: She is alert.  Psychiatric:        Mood and Affect: Mood normal.        Behavior: Behavior normal.      UC Treatments / Results  Labs (all labs ordered are listed, but only abnormal results are displayed) Labs Reviewed  SARS CORONAVIRUS 2 (TAT 6-24 HRS)    EKG   Radiology No results  found.  Procedures Procedures (including critical care time)  Medications Ordered in UC Medications - No data to display  Initial Impression / Assessment and Plan / UC Course  I have reviewed the triage vital signs and the nursing notes.  Pertinent labs & imaging results that were available during my care of the patient were reviewed by me and considered in my medical decision making (see chart for details).    Reviewed how to get test results.  Quarantine. Final Clinical Impressions(s) / UC Diagnoses   Final diagnoses:  Exposure to COVID-19 virus     Discharge Instructions     Go home to rest Drink plenty of fluids Take Tylenol if needed for pain or fever You may take over-the-counter cough and cold medicines as needed You must quarantine at home until your test result is available You can check for your test result in MyChart    ED Prescriptions    None     PDMP not reviewed this encounter.   Eustace Moore, MD 10/20/19 2120

## 2019-10-21 LAB — SARS CORONAVIRUS 2 (TAT 6-24 HRS): SARS Coronavirus 2: NEGATIVE

## 2019-10-25 ENCOUNTER — Other Ambulatory Visit: Payer: Self-pay

## 2019-10-25 ENCOUNTER — Telehealth (INDEPENDENT_AMBULATORY_CARE_PROVIDER_SITE_OTHER): Payer: 59 | Admitting: *Deleted

## 2019-10-25 DIAGNOSIS — Z8619 Personal history of other infectious and parasitic diseases: Secondary | ICD-10-CM

## 2019-10-25 DIAGNOSIS — O99340 Other mental disorders complicating pregnancy, unspecified trimester: Secondary | ICD-10-CM

## 2019-10-25 DIAGNOSIS — Z3490 Encounter for supervision of normal pregnancy, unspecified, unspecified trimester: Secondary | ICD-10-CM | POA: Insufficient documentation

## 2019-10-25 DIAGNOSIS — N739 Female pelvic inflammatory disease, unspecified: Secondary | ICD-10-CM

## 2019-10-25 DIAGNOSIS — F329 Major depressive disorder, single episode, unspecified: Secondary | ICD-10-CM

## 2019-10-25 DIAGNOSIS — B9689 Other specified bacterial agents as the cause of diseases classified elsewhere: Secondary | ICD-10-CM | POA: Insufficient documentation

## 2019-10-25 DIAGNOSIS — F4320 Adjustment disorder, unspecified: Secondary | ICD-10-CM | POA: Insufficient documentation

## 2019-10-25 DIAGNOSIS — F32A Depression, unspecified: Secondary | ICD-10-CM

## 2019-10-25 DIAGNOSIS — F419 Anxiety disorder, unspecified: Secondary | ICD-10-CM

## 2019-10-25 HISTORY — DX: Female pelvic inflammatory disease, unspecified: N73.9

## 2019-10-25 NOTE — Addendum Note (Signed)
Addended by: Gerome Apley on: 10/25/2019 10:12 AM   Modules accepted: Orders

## 2019-10-25 NOTE — Patient Instructions (Signed)

## 2019-10-25 NOTE — Progress Notes (Signed)
I connected with  Amanda Kerr on 10/25/19 at  8:15 AM EDT by virtually and verified that I am speaking with the correct person using two identifiers.   I discussed the limitations, risks, security and privacy concerns of performing an evaluation and management service by / virtualland the availability of in person appointments. I also discussed with the patient that there may be a patient responsible charge related to this service. The patient expressed understanding and agreed to proceed.  I explained I am completing her New OB Intake today. We discussed Her EDD and that it is based on  sure LMP( within days).  I reviewed her allergies, meds, OB History, Medical /Surgical history, and appropriate screenings. She is G2P0010 with history of HSV.   I informed her of [facility] services.   I explained I will send her the [employer] app and app was sent to her while on phone.She will download after virtual visit.    I explained we will ask her to take her blood pressure weekly during her pregnancy. She confirmed she does not have a blood presure cuff and has [employer] as her primary insurance. I explained that [employer] does not cover a blood pressure cuff RX and asked if she can purchase one. I asked her to bring the blood pressure cuff with her to her first ob appointment so we can show her how to use it. Explained  then we will have her take her blood pressure weekly and enter into the app.  I explained she will have some visits in office and some virtually. She already has [employer].  I reviewed her new ob  appointment date/ time with her , our location and to wear mask.   I explained she will have a pelvic exam, ob bloodwork, hemoglobin a1C, cbg ,( had pap recently) , and  genetic testing if desired,- she is unsure about a panorama. I explained I will  schedule an [place] at 19 weeks and she will see the  Appointment in MyChart later today. She voices understanding.  [name],RN 10/25/2019  8:28 AM

## 2019-10-27 ENCOUNTER — Ambulatory Visit (INDEPENDENT_AMBULATORY_CARE_PROVIDER_SITE_OTHER): Payer: 59 | Admitting: Medical

## 2019-10-27 ENCOUNTER — Other Ambulatory Visit (HOSPITAL_COMMUNITY)
Admission: RE | Admit: 2019-10-27 | Discharge: 2019-10-27 | Disposition: A | Discharge status: Home / Self Care | Payer: 59 | Source: Ambulatory Visit | Attending: Medical | Admitting: Medical

## 2019-10-27 ENCOUNTER — Encounter: Payer: Self-pay | Admitting: Medical

## 2019-10-27 ENCOUNTER — Other Ambulatory Visit: Payer: Self-pay

## 2019-10-27 VITALS — BP 124/78 | HR 98 | Wt 182.0 lb

## 2019-10-27 DIAGNOSIS — F329 Major depressive disorder, single episode, unspecified: Secondary | ICD-10-CM

## 2019-10-27 DIAGNOSIS — F419 Anxiety disorder, unspecified: Secondary | ICD-10-CM

## 2019-10-27 DIAGNOSIS — Z3491 Encounter for supervision of normal pregnancy, unspecified, first trimester: Secondary | ICD-10-CM | POA: Insufficient documentation

## 2019-10-27 DIAGNOSIS — Z3A10 10 weeks gestation of pregnancy: Secondary | ICD-10-CM

## 2019-10-27 DIAGNOSIS — Z8709 Personal history of other diseases of the respiratory system: Secondary | ICD-10-CM

## 2019-10-27 DIAGNOSIS — F32A Depression, unspecified: Secondary | ICD-10-CM

## 2019-10-27 DIAGNOSIS — B009 Herpesviral infection, unspecified: Secondary | ICD-10-CM

## 2019-10-27 NOTE — Progress Notes (Signed)
   PRENATAL VISIT NOTE  Subjective:  Amanda Kerr is a 29 y.o. G2P0010 at [redacted]w[redacted]d being seen today for her first prenatal visit for this pregnancy.  She is currently monitored for the following issues for this low-risk pregnancy and has Anxiety and depression; History of asthma; Allodynia; HSV (herpes simplex virus); Supervision of low-risk pregnancy; Female pelvic inflammatory disease; and Adjustment disorder on their problem list.  Patient reports nausea.  Contractions: Not present. Vag. Bleeding: None.  Movement: Absent. Denies leaking of fluid.   She is planning to breastfeed. Desires contraception, method unsure.    The following portions of the patient's history were reviewed and updated as appropriate: allergies, current medications, past family history, past medical history, past social history, past surgical history and problem list.   Objective:   Vitals:   10/27/19 0913  BP: 124/78  Pulse: 98  Weight: 182 lb (82.6 kg)    Fetal Status: Fetal Heart Rate (bpm): 176   Movement: Absent     General:  Alert, oriented and cooperative. Patient is in no acute distress.  Skin: Skin is warm and dry. No rash noted.   Cardiovascular: Normal heart rate and rhythm noted  Respiratory: Normal respiratory effort, no problems with respiration noted. Clear to auscultation.   Abdomen: Soft, gravid, appropriate for gestational age. Normal bowel sounds. Non-tender. Pain/Pressure: Absent     Pelvic: Deferred  Extremities: Normal range of motion.  Edema: None  Mental Status: Normal mood and affect. Normal behavior. Normal judgment and thought content.   Assessment and Plan:  Pregnancy: G2P0010 at [redacted]w[redacted]d 1. Encounter for supervision of low-risk pregnancy in first trimester - Culture, OB Urine - Hemoglobin A1c - CBC/D/Plt+RPR+Rh+ABO+Rub Ab... - GC/Chlamydia probe amp ([place])not at [facility] - Genetic Screening - NIPS and Horizon - Normal pap 05/10/2019  2. History of asthma - Childhood  issue, no recent issues  3. HSV (herpes simplex virus) - Last outbreak months ago, no current symptoms  - Discussed need for ppx at 34-36 weeks  4. Anxiety and depression - No medications previously - Discussed availability of [facility] services   5. [redacted] weeks gestation of pregnancy  Preterm labor/first trimester warning symptoms and general obstetric precautions including but not limited to vaginal bleeding, contractions, leaking of fluid and fetal movement were reviewed in detail with the patient. Please refer to After Visit Summary for other counseling recommendations.   Discussed the normal visit cadence for prenatal care Discussed the nature of our practice with multiple providers including residents and students   Return in about 4 weeks (around 11/24/2019) for LOB, In-Person.  Future Appointments  Date Time Provider Department Center  11/24/2019  9:35 AM Kathlene Cote [facility]  12/25/2019  7:45 AM [facility]    Vonzella Nipple, PA-C

## 2019-10-27 NOTE — Progress Notes (Signed)
BP given cuff to patient. Education on use & return demonstration provided

## 2019-10-27 NOTE — Patient Instructions (Addendum)
Childbirth Education Options: [facility] Classes:  Childbirth education classes can help you get ready for a positive parenting experience. You can also meet other expectant parents and get free stuff for your baby. Each class runs for five weeks on the same night and costs $45 for the mother-to-be and her support person. Medicaid covers the cost if you are eligible. Call [phone] to register. [facility] Childbirth Education:  [phone] or [phone] or [email or web address]  Baby & Me Class: Discuss newborn & infant parenting and family adjustment issues with other new mothers in a relaxed environment. Each week brings a new speaker or baby-centered activity. We encourage new mothers to join us every Thursday at 11:00am. Babies birth until crawling. No registration or fee. Daddy Boot Camp: This course offers Dads-to-be the tools and knowledge needed to feel confident on their journey to becoming new fathers. Experienced dads, who have been trained as coaches, teach dads-to-be how to hold, comfort, diaper, swaddle and play with their infant while being able to support the new mom as well. A class for men taught by men. $25/dad Big Brother/Big Sister: Let your children share in the joy of a new brother or sister in this special class designed just for them. Class includes discussion about how families care for babies: swaddling, holding, diapering, safety as well as how they can be helpful in their new role. This class is designed for children ages 2 to 6, but any age is welcome. Please register each child individually. $5/child  Mom Talk: This mom-led group offers support and connection to mothers as they journey through the adjustments and struggles of that sometimes overwhelming first year after the birth of a child. Tuesdays at 10:00am and Thursdays at 6:00pm. Babies welcome. No registration or fee. Breastfeeding Support Group: This group is a mother-to-mother  support circle where moms have the opportunity to share their breastfeeding experiences. A Lactation Consultant is present for questions and concerns. Meets each Tuesday at 11:00am. No fee or registration. Breastfeeding Your Baby: Learn what to expect in the first days of breastfeeding your newborn.  This class will help you feel more confident with the skills needed to begin your breastfeeding experience. Many new mothers are concerned about breastfeeding after leaving the hospital. This class will also address the most common fears and challenges about breastfeeding during the first few weeks, months and beyond. (call for fee) Comfort Techniques and Tour: This 2 hour interactive class will provide you the opportunity to learn & practice hands-on techniques that can help relieve some of the discomfort of labor and encourage your baby to rotate toward the best position for birth. You and your partner will be able to try a variety of labor positions with birth balls and rebozos as well as practice breathing, relaxation, and visualization techniques. A tour of the [facility] is included with this class. $20 per registrant and support person Childbirth Class- Weekend Option: This class is a Weekend version of our Birth & Baby series. It is designed for parents who have a difficult time fitting several weeks of classes into their schedule. It covers the care of your newborn and the basics of labor and childbirth. It also includes a Maternity Care Center Tour of [facility] and lunch. The class is held two consecutive days: beginning on Friday evening from 6:30 - 8:30 p.m. and the next day, Saturday from 9 a.m. - 4 p.m. (call for fee) Waterbirth Class: Interested in a waterbirth?  This   informational class will help you discover whether waterbirth is the right fit for you. Education about waterbirth itself, supplies you would need and how to assemble your support team is what you can  expect from this class. Some obstetrical practices require this class in order to pursue a waterbirth. (Not all obstetrical practices offer waterbirth-check with your healthcare provider.) Register only the expectant mom, but you are encouraged to bring your partner to class! Required if planning waterbirth, no fee. Infant/Child CPR: Parents, grandparents, babysitters, and friends learn Cardio-Pulmonary Resuscitation skills for infants and children. You will also learn how to treat both conscious and unconscious choking in infants and children. This Family & Friends program does not offer certification. Register each participant individually to ensure that enough mannequins are available. (Call for fee) Grandparent Love: Expecting a grandbaby? This class is for you! Learn about the latest infant care and safety recommendations and ways to support your own child as he or she transitions into the parenting role. Taught by Registered Nurses who are childbirth instructors, but most importantly...they are grandmothers too! $10/person. Childbirth Class- Natural Childbirth: This series of 5 weekly classes is for expectant parents who want to learn and practice natural methods of coping with the process of labor and childbirth. Relaxation, breathing, massage, visualization, role of the partner, and helpful positioning are highlighted. Participants learn how to be confident in their body's ability to give birth. This class will empower and help parents make informed decisions about their own care. Includes discussion that will help new parents transition into the immediate postpartum period. Maternity Care Center Tour of [facility] is included. We suggest taking this class between 25-32 weeks, but it's only a recommendation. $75 per registrant and one support person or $30 Medicaid. Childbirth Class- 3 week Series: This option of 3 weekly classes helps you and your labor partner prepare for childbirth. Newborn  care, labor & birth, cesarean birth, pain management, and comfort techniques are discussed and a Maternity Care Center Tour of [facility] is included. The class meets at the same time, on the same day of the week for 3 consecutive weeks beginning with the starting date you choose. $60 for registrant and one support person.  Marvelous Multiples: Expecting twins, triplets, or more? This class covers the differences in labor, birth, parenting, and breastfeeding issues that face multiples' parents. NICU tour is included. Led by a Certified Childbirth Educator who is the mother of twins. No fee. Caring for Baby: This class is for expectant and adoptive parents who want to learn and practice the most up-to-date newborn care for their babies. Focus is on birth through the first six weeks of life. Topics include feeding, bathing, diapering, crying, umbilical cord care, circumcision care and safe sleep. Parents learn to recognize symptoms of illness and when to call the pediatrician. Register only the mom-to-be and your partner or support person can plan to come with you! $10 per registrant and support person Childbirth Class- online option: This online class offers you the freedom to complete a Birth and Baby series in the comfort of your own home. The flexibility of this option allows you to review sections at your own pace, at times convenient to you and your support people. It includes additional video information, animations, quizzes, and extended activities. Get organized with helpful eClass tools, checklists, and trackers. Once you register online for the class, you will receive an email within a few days to accept the invitation and begin the class when the time   is right for you. The content will be available to you for 60 days. $60 for 60 days of online access for you and your support people.  Safe Medications in Pregnancy   Acne:  Benzoyl Peroxide  Salicylic Acid   Backache/Headache:  Tylenol: 2  regular strength every 4 hours OR        2 Extra strength every 6 hours   Colds/Coughs/Allergies:  Benadryl (alcohol free) 25 mg every 6 hours as needed  Breath right strips  Claritin  Cepacol throat lozenges  Chloraseptic throat spray  Cold-Eeze- up to three times per day  Cough drops, alcohol free  Flonase (by prescription only)  Guaifenesin  Mucinex  Robitussin DM (plain only, alcohol free)  Saline nasal spray/drops  Sudafed (pseudoephedrine) & Actifed * use only after [redacted] weeks gestation and if you do not have high blood pressure  Tylenol  Vicks Vaporub  Zinc lozenges  Zyrtec   Constipation:  Colace  Ducolax suppositories  Fleet enema  Glycerin suppositories  Metamucil  Milk of magnesia  Miralax  Senokot  Smooth move tea   Diarrhea:  Kaopectate  Imodium A-D   *NO pepto Bismol   Hemorrhoids:  Anusol  Anusol HC  Preparation H  Tucks   Indigestion:  Tums  Maalox  Mylanta  Zantac  Pepcid   Insomnia:  Benadryl (alcohol free)  every 6 hours as needed  Tylenol PM  Unisom, no Gelcaps   Leg Cramps:  Tums  MagGel   Nausea/Vomiting:  Bonine  Dramamine  Emetrol  Ginger extract  Sea bands  Meclizine  Nausea medication to take during pregnancy:  Unisom (doxylamine succinate 25 mg tablets) Take one tablet daily at bedtime. If symptoms are not adequately controlled, the dose can be increased to a maximum recommended dose of two tablets daily (1/2 tablet in the morning, 1/2 tablet mid-afternoon and one at bedtime).  Vitamin B6  tablets. Take one tablet twice a day (up to 200 mg per day).   Skin Rashes:  Aveeno products  Benadryl cream or  every 6 hours as needed  Calamine Lotion  1% cortisone cream   Yeast infection:  Gyne-lotrimin 7  Monistat 7    **If taking multiple medications, please check labels to avoid duplicating the same active ingredients  **take medication as directed on the label  ** Do not exceed 4000 mg of  tylenol in 24 hours  **Do not take medications that contain aspirin or ibuprofen          First Trimester of Pregnancy  The first trimester of pregnancy is from week 1 until the end of week 13 (months 1 through 3). During this time, your baby will begin to develop inside you. At 6-8 weeks, the eyes and face are formed, and the heartbeat can be seen on ultrasound. At the end of 12 weeks, all the baby's organs are formed. Prenatal care is all the medical care you receive before the birth of your baby. Make sure you get good prenatal care and follow all of your doctor's instructions. Follow these instructions at home: Medicines  Take over-the-counter and prescription medicines only as told by your doctor. Some medicines are safe and some medicines are not safe during pregnancy.  Take a prenatal vitamin that contains at least 600 micrograms (mcg) of folic acid.  If you have trouble pooping (constipation), take medicine that will make your stool soft (stool softener) if your doctor approves. Eating and drinking   Eat regular, healthy meals.  Your doctor will tell you the amount of weight gain that is right for you.  Avoid raw meat and uncooked cheese.  If you feel sick to your stomach (nauseous) or throw up (vomit): ? Eat 4 or 5 small meals a day instead of 3 large meals. ? Try eating a few soda crackers. ? Drink liquids between meals instead of during meals.  To prevent constipation: ? Eat foods that are high in fiber, like fresh fruits and vegetables, whole grains, and beans. ? Drink enough fluids to keep your pee (urine) clear or pale yellow. Activity  Exercise only as told by your doctor. Stop exercising if you have cramps or pain in your lower belly (abdomen) or low back.  Do not exercise if it is too hot, too humid, or if you are in a place of great height (high altitude).  Try to avoid standing for long periods of time. Move your legs often if you must stand in one place  for a long time.  Avoid heavy lifting.  Wear low-heeled shoes. Sit and stand up straight.  You can have sex unless your doctor tells you not to. Relieving pain and discomfort  Wear a good support bra if your breasts are sore.  Take warm water baths (sitz baths) to soothe pain or discomfort caused by hemorrhoids. Use hemorrhoid cream if your doctor says it is okay.  Rest with your legs raised if you have leg cramps or low back pain.  If you have puffy, bulging veins (varicose veins) in your legs: ? Wear support hose or compression stockings as told by your doctor. ? Raise (elevate) your feet for 15 minutes, 3-4 times a day. ? Limit salt in your food. Prenatal care  Schedule your prenatal visits by the twelfth week of pregnancy.  Write down your questions. Take them to your prenatal visits.  Keep all your prenatal visits as told by your doctor. This is important. Safety  Wear your seat belt at all times when driving.  Make a list of emergency phone numbers. The list should include numbers for family, friends, the hospital, and police and fire departments. General instructions  Ask your doctor for a referral to a local prenatal class. Begin classes no later than at the start of month 6 of your pregnancy.  Ask for help if you need counseling or if you need help with nutrition. Your doctor can give you advice or tell you where to go for help.  Do not use hot tubs, steam rooms, or saunas.  Do not douche or use tampons or scented sanitary pads.  Do not cross your legs for long periods of time.  Avoid all herbs and alcohol. Avoid drugs that are not approved by your doctor.  Do not use any tobacco products, including cigarettes, chewing tobacco, and electronic cigarettes. If you need help quitting, ask your doctor. You may get counseling or other support to help you quit.  Avoid cat litter boxes and soil used by cats. These carry germs that can cause birth defects in the baby  and can cause a loss of your baby (miscarriage) or stillbirth.  Visit your dentist. At home, brush your teeth with a soft toothbrush. Be gentle when you floss. Contact a doctor if:  You are dizzy.  You have mild cramps or pressure in your lower belly.  You have a nagging pain in your belly area.  You continue to feel sick to your stomach, you throw up, or you have watery  poop (diarrhea).  You have a bad smelling fluid coming from your vagina.  You have pain when you pee (urinate).  You have increased puffiness (swelling) in your face, hands, legs, or ankles. Get help right away if:  You have a fever.  You are leaking fluid from your vagina.  You have spotting or bleeding from your vagina.  You have very bad belly cramping or pain.  You gain or lose weight rapidly.  You throw up blood. It may look like coffee grounds.  You are around people who have [place] measles, fifth disease, or chickenpox.  You have a very bad headache.  You have shortness of breath.  You have any kind of trauma, such as from a fall or a car accident. Summary  The first trimester of pregnancy is from week 1 until the end of week 13 (months 1 through 3).  To take care of yourself and your unborn baby, you will need to eat healthy meals, take medicines only if your doctor tells you to do so, and do activities that are safe for you and your baby.  Keep all follow-up visits as told by your doctor. This is important as your doctor will have to ensure that your baby is healthy and growing well. This information is not intended to replace advice given to you by your health care provider. Make sure you discuss any questions you have with your health care provider. Document Revised: 07/07/2018 Document Reviewed: 03/24/2016 Elsevier Patient Education  2020 Elsevier Inc.  AREA PEDIATRIC/FAMILY PRACTICE PHYSICIANS  [place] ([address]) . [facility] Melodie Bouillon,  MD; Lum Babe, MD; Sheffield Slider, MD; Leveda Anna, MD; McDiarmid, MD; Jerene Bears, MD; Jennette Kettle, MD; Gwendolyn Grant, MD o [address] [place] [place], [place] [address] o [phone] o Mon-Fri 8:30-12:30, 1:30-5:00 o Providers come to see babies at [facility] o Accepting Medicaid . [facility] o Limited providers who accept newborns: Docia Chuck, MD; Kateri Plummer, MD; Paulino Rily, MD o [address], [place], [place] [address] o [phone] o Mon-Fri 8:00-5:30 o Babies seen by providers at [facility] o Does NOT accept Medicaid o Please call early in hospitalization for appointment (limited availability)  . [facility] Fatima Sanger, MD o [address], [place], [place] [address] o [phone] o Mon, Tue, Thur, Fri 8:30-5:00, Wed 10:00-7:00 (closed 1-2pm) o Babies seen by [facility] providers o Accepting Medicaid . Donnie Coffin - Pediatrician Fae Pippin, MD o [address], [place], [place] [address] o [phone] o Mon-Fri 8:30-5:00, Sat 8:30-12:00 o Provider comes to see babies at [facility] o Accepting Medicaid o Must have been referred from current patients or contacted office prior to delivery . [facility]) Leotis Pain, MD; Ave Filter, MD; Luna Fuse, MD; Kennedy Bucker, MD; Konrad Dolores, MD; Kathlene November, MD; Jenne Campus, MD; Lubertha South, MD; Wynetta Emery, MD; Duffy Rhody, MD; Gerre Couch, NP; Shirl Harris, NP o [address] [place], [place], [place] [address] o [phone] o Mon, Tue, Thur, Fri 8:30-5:30, Wed 9:30-5:30, Sat 8:30-12:30 o Babies seen by [facility] providers o Accepting Medicaid o Only accepting infants of first-time parents or siblings of current patients [facility] discharge coordinator will make follow-up appointment . Cyril Mourning o [address], [place], [place]  [address] o [phone]   Fax - [phone] . [facility] o [address], [place], [place]   [address] o Phone - [phone]   Fax - [phone] .  Lucio Edward o [address], [place], [place]  [address] o [phone]  [place] [phone]) . [facility] Jorge Mandril, MD; Alita Chyle, MD; Princella Ion, MD; MD; Earlene Plater, MD; Jamesetta Orleans, MD; Alvera Novel, MD; Clarene Duke, MD; Rana Snare, MD; Carmon Ginsberg, MD; Alinda Money, MD; Hosie Poisson, MD; Mayford Knife, MD o [address], [place], [place] [address] o [phone] o Mon-Fri 8:30-5:00 (extended evenings Mon-Thur as needed), Sat-Sun 10:00-1:00 o Providers come to see babies at [facility] o Accepting Medicaid for families of first-time babies and families with all children in the household age 1 and under. Must register with office prior to making appointment (M-F only). [facility] Odella Aquas, NP; Lynelle Doctor, MD; Susann Givens, MD; [place], [place] o [address], [place], [place] [address] o [phone] o Mon-Fri 8:00-5:00 o Babies seen by providers at [facility] o Does NOT accept Medicaid/Commercial Insurance Only . [facility] [place] [facility]  Suzette Battiest, MD; Zachery Dauer, MD; Stefan Church, MD; Sabino Dick, MD; Quitman Livings, MD; Farris Has, MD; Gaynell Face, MD; Betha Loa, MD; Colon Flattery, MD; Clifton James, MD o [address] [place] [place], [place] [address] o [phone] o Mon-Fri 8:30-5:30, Sat (Oct.-Mar.) 9:00-1:00 o Babies seen by providers at [facility] o Accepting [facility] [phone]) . [facility] Dimitri, MD; Sheliah Hatch, MD o [address], [place], [place] [address] o [phone] o Mon-Fri 8:30-5:00, Sat 8:30-12:00 o Providers come to see babies at [facility] o Does NOT accept Medicaid . [facility] Cindy Hazy, [place]; [place], MD; [place], [place]; Wynelle Link, MD; Azucena Cecil, MD o [address], [place], [place] [address] o [phone] o Mon-Fri 8:00-5:00 o Babies seen by providers at [facility] o Does NOT accept  Medicaid o Only accepting babies of parents who are patients o Please call early in hospitalization for appointment (limited availability) . [facility] Lamar Benes, MD; Abran Cantor, MD; Early Osmond, MD; Cherre Huger, NP; Hyacinth Meeker, MD; Dwan Bolt, MD; Jarold Motto, NP; Dario Guardian, MD; Talmage Nap, MD; Maisie Fus, MD; Pricilla Holm, MD; Tama High, MD o [address] [place], [place], [place] [address] o [phone] o Mon-Fri 8:00-5:00, Sat 9:00-12:00 o Providers come to see babies at [facility] o Does NOT accept [facility] [phone]) . [facility] o Limited providers accepting new patients: Drema Pry, NP; Dixon, PA o [address] [place], [place] [address] o [phone] o Mon-Fri 8:00-5:00 o Babies seen by providers at [facility] o Does NOT accept Medicaid o Only accepting babies of parents who are patients o Please call early in hospitalization for appointment (limited availability) . [facility] Luan Pulling, MD; Nash Dimmer, MD o [address] [place] [place], [place] [address] o [phone] (press 1 to schedule appointment) o Mon-Fri 8:00-5:00 o Providers come to see babies at [facility] o Does NOT accept Medicaid . [facility] Cristino Martes, MD o [address] [place], [place] [address] o [phone] o Mon-Fri 8:30-5:00 (lunch 12:30-1:00), extended hours by appointment only Wed 5:00-6:30 o Babies seen by [facility] providers o Accepting Medicaid . [facility] Abbot, MD; [place], MD; Hassan Rowan, MD o [address] [place], [place], [place] [address] o [phone] o Mon-Fri 8:00-5:00 o Babies seen by [facility] providers o Does NOT accept Medicaid . Nature conservation officer at [place] [address] [place] Soho, MD; Durene Cal, MD; Verdig[place], DO o [address] [place], [place] [address] o [phone] o Mon-Fri 8:00-5:00 o Babies seen by [facility] providers o Does NOT accept Medicaid . [facility] Clarene Reamer, [place]; [place], [place]; Goodwin, NP; Avis Epley, MD; Vonna Kotyk, MD; Clance Boll, MD; Stevphen Rochester, NP; Arvilla Market, NP; Ann Maki, NP; Otis Dials, NP; Vaughan Basta, MD; [place], MD o [address] [place], [place] [address] o [phone] o Mon-Fri 8:30-5:00, Sat 10:00-1:00 o Providers come to see babies at [facility] o Does NOT accept Medicaid o Free prenatal information session Tuesdays at 4:45pm . [facility] Luna Kitchens, MD; [place], [place]; [place], [place]; [place], [place] o [address] [place] [address] o [phone] o Mon-Fri 7:30-5:30 o Babies seen by [facility] providers . [facility] Children's Doctor o [address], [place], [place]  [address] o [phone]   Fax - [phone]  [place] [phone] & [phone]) . [facility] Alphonsa Overall, MD o [address] [place], [place] [address] o [phone] o Mon-Thur 8:00-6:00 o Providers come to see babies at [facility] o Accepting Medicaid . [facility] Zenon Mayo, NP; Cyndia Bent, MD; [place], [place]; [place], [place] o [address] [place], [place] [address] o [phone] o Mon-Thur 7:30-7:30, Fri 7:30-4:30 o Babies seen by [facility] providers o Accepting Medicaid . [facility] Cheryle Horsfall, MD; Janene Harvey, NP; Vonita Moss, MD o [address], [place], [place] [address] o [phone] o Mon-Fri 8:30-5:00, Sat 8:30-12:00 o Providers come to see babies at [facility] o Accepting Medicaid o Must have "Meet & Greet" appointment at office prior to delivery . [facility] [facility] [place] ([facility]) Llana Aliment, MD; Earlene Plater, MD; Lucretia Roers, MD o [address], [place], [place] [address] o [phone] o Mon-Wed 8:00-6:00, Thur-Fri 8:00-5:00, Sat 9:00-12:00 o Providers come to see babies at [facility] o Does NOT accept Medicaid o Only accepting siblings of current patients . [facility]  o [address], [address], [place], [place]  [address] o [phone]   Fax - [phone] . [facility] at [facility] o [phone] [address] [place], [place]  [address] o [phone]   Fax - [phone]  [place] [phone] & [phone]) . Nature conservation officer at [facility] o [place], DO; Nora, DO o [address] [place], [place] [address] o [phone] o Mon-Fri 7:00-5:00 o Babies seen by [facility] providers o Does NOT accept Medicaid . [facility] Ellis Savage, MD; [place], [place]; [place], [place] o [address], [place], [place] [address] o [phone] o Mon-Fri 8:00-5:00 o Babies seen by [facility] providers o Accepting Medicaid . [facility] - [address] Franne Forts, MD; [place], [place]; [place], NP; [place], [place] o [address] [place], [place], [place] [address] o [phone] o Mon-Fri 8:00-5:00 o Babies seen by providers at [facility] o Accepting [facility] [place] [phone]) . [facility] at [facility] [place], [place] o [address] [place], [place] [address] o [phone] o Mon-Fri 8:00-5:00 o Babies seen by [facility] providers o Does NOT accept Medicaid o Limited availability, please call early in hospitalization to schedule follow-up . [facility] Jolee Ewing, PA; Eddie Candle, MD; [place], MD; [place], [place]; Constance Goltz, MD; Fernando [place], [place] o [address] [facility] [address], [place], [place] [address] o [phone] o Mon-Fri 8:30-5:00, Sat 9:00-12:00 o Babies seen by providers at [facility] o Accepting Medicaid o Please register online then schedule online or call office o [email or web address] . [facility] - [facility] at [employer]) o  Durene Cal, NP; Lucianne Muss, MD; Lanier Clam, PA o [address], [place], [place]  [address] o [phone] o Mon-Fri 8:00-5:00 o Babies seen by providers at [facility] o Accepting Medicaid . [facility] [place] ([facility]) Sharin Mons, MD; Reed Breech, NP; Shelva Majestic, MD o [address], [place], [place] [address] o [phone] o Mon-Fri 8:00-5:30, Sat&Sun by appointment (phones open at 8:30) o Babies seen by [facility] providers o Accepting Medicaid o Must be a first-time baby or sibling of current patient . [facility] [address], [place], [place]  [address] o [phone]   Fax - [phone]  [place] [phone] & [phone]) . [facility] o [place], [place]; [place], [place]; Dimple Casey, MD; [place], [place]; Carolyne Fiscal, MD o [address] [place], [place] [address] o [phone] o Mon-Thur 8:00-7:00, Fri 8:00-5:00, Sat 8:00-12:00, Sun 9:00-12:00 o Babies seen by [facility] providers o Accepting Medicaid . [facility] at [facility], MD; Gaynell Face, MD; [facility], MD o [address], [place], [place] [address] o [phone] o Mon-Thur 8:00-5:00 o Babies seen by providers at [facility] o Accepting Medicaid . [facility] at [place] Gwenlyn Saran, MD; [place], MD; Madilyn Fireman, MD; Melvyn Neth, MD; List, MD; Lazarus Salines, MD; Gaynell Face, MD; Berneda Rose, MD; Flora Lipps, MD; Beryl Meager, MD; Luther Redo, MD; Lavonia Drafts, MD; Kellie Simmering, MD o [address] [place] [place], [place] [address] o [phone] o Mon-Fri 8:00-5:30, Sat (Oct.-Mar.) 9:00-1:00 o Babies seen by providers at [facility] o Accepting Medicaid o Must fill out new patient packet, available online at [email or web address] . [facility] [place] [facility] at [facility]) Simone Curia, NP; Tiburcio Pea, NP; Tresa Endo, NP; Whitney Post, MD; [place], [place]; Hennie Duos, MD; Wynne Dust, MD; Kavin Leech, NP o [address], [place], [place]  [address] o [phone] o Mon-Thur 8:00-5:30, Fri 8:00-5:00 o Babies seen by providers at [facility] o Accepting [facility] [phone]) . [facility] o [place], [place]; [place], MD; Tanya Nones, MD; [place], [place] o [address] [place], [place] [address] o [phone] o Mon-Fri 8:00-5:00 o Babies seen by providers at [facility] o Accepting [facility] [phone]) . [facility] at [facility] o [place], DO; Lenise Arena, MD; [place], [place] o [address], [place], [place] [address] o [phone] o Mon-Fri 8:00-5:00 o Babies seen by providers at [facility] o Does NOT accept Medicaid o Limited appointment availability, please call early in hospitalization  . Nature conservation officer at [facility] o [place], DO; [place], MD o [address] [place], [place] [address] o [phone] o Mon-Fri 8:00-5:00 o Babies seen by [facility] providers o Does NOT accept Medicaid . [facility] Lorrine Kin, MD; Ninetta Lights, MD; [place], [place]; [place], MD o [address] [facility] [address], [place], [place] [address] o [phone] o Mon-Fri 8:00-5:00 o After hours clinic [facility] [place], [place] [address]) [phone] Mon-Fri 5:00-8:00, Sat 12:00-6:00, Sun 10:00-4:00 o Babies seen by [facility] providers o Accepting Medicaid . [facility] at [facility] o [address] [place], [place]  [address] o [phone]   Fax - [phone]  [place] [phone]) . Nature conservation officer at [facility], MD o [address] [place], [place], [place] [address] o [phone] o Mon-Fri 8:00-5:00 o  Babies seen by [facility] providers o Does NOT accept Medicaid . [facility] [place] [facility] [place] Tomi Likenso Eksir, MD o [address] [place] [address], [place], [place] [address] o [phone] o Mon-Thur 8:00-7:00, Fri 8:00-5:00, Sat 8:00-12:00 o Babies seen by providers at  [facility] o Accepting Medicaid - but does not have vaccinations in office (must be received elsewhere) o Limited availability, please call early in hospitalization  [place] ([phone]) . [facility]  o Wyvonne Lenzharlene Flemming, MD o [address], [place] [address] o [phone]  Fax [phone]

## 2019-10-28 LAB — CBC/D/PLT+RPR+RH+ABO+RUB AB...
Antibody Screen: NEGATIVE
Basophils Absolute: 0 10*3/uL (ref 0.0–0.2)
Basos: 1 %
EOS (ABSOLUTE): 0 10*3/uL (ref 0.0–0.4)
Eos: 0 %
HCV Ab: <0.1 s/co ratio (ref 0.0–0.9)
HIV Screen 4th Generation wRfx: NONREACTIVE
Hematocrit: 38.2 % (ref 34.0–46.6)
Hemoglobin: 13 g/dL (ref 11.1–15.9)
Hepatitis B Surface Ag: NEGATIVE
Immature Grans (Abs): 0 10*3/uL (ref 0.0–0.1)
Immature Granulocytes: 0 %
Lymphocytes Absolute: 1.6 10*3/uL (ref 0.7–3.1)
Lymphs: 32 %
MCH: 28.2 pg (ref 26.6–33.0)
MCHC: 34 g/dL (ref 31.5–35.7)
MCV: 83 fL (ref 79–97)
Monocytes Absolute: 0.3 10*3/uL (ref 0.1–0.9)
Monocytes: 6 %
Neutrophils Absolute: 3.1 10*3/uL (ref 1.4–7.0)
Neutrophils: 61 %
Platelets: 339 10*3/uL (ref 150–450)
RBC: 4.61 x10E6/uL (ref 3.77–5.28)
RDW: 14.1 % (ref 11.7–15.4)
RPR Ser Ql: NONREACTIVE
Rh Factor: NEGATIVE
Rubella Antibodies, IGG: 3.48 index (ref >0.99)
WBC: 5.1 10*3/uL (ref 3.4–10.8)

## 2019-10-28 LAB — HEMOGLOBIN A1C
Est. average glucose Bld gHb Est-mCnc: 111 mg/dL
Hgb A1c MFr Bld: 5.5 % (ref 4.8–5.6)

## 2019-10-28 LAB — HCV INTERPRETATION

## 2019-10-29 LAB — URINE CULTURE, OB REFLEX

## 2019-10-29 LAB — CULTURE, OB URINE

## 2019-10-30 LAB — GC/CHLAMYDIA PROBE AMP (~~LOC~~) NOT AT ARMC
Chlamydia: NEGATIVE
Comment: NEGATIVE
Comment: NORMAL
Neisseria Gonorrhea: NEGATIVE

## 2019-11-16 ENCOUNTER — Telehealth: Payer: Self-pay | Admitting: Lactation Services

## 2019-11-16 ENCOUNTER — Encounter: Payer: Self-pay | Admitting: *Deleted

## 2019-11-16 NOTE — Telephone Encounter (Signed)
Called patient with results from [employer]. Patient did not answer, LM for patient to call the office at her convenience for results. My Chart Message sent.

## 2019-11-17 ENCOUNTER — Encounter: Payer: Self-pay | Admitting: Medical

## 2019-11-17 DIAGNOSIS — D563 Thalassemia minor: Secondary | ICD-10-CM | POA: Insufficient documentation

## 2019-11-24 ENCOUNTER — Encounter: Payer: Self-pay | Admitting: Medical

## 2019-11-24 ENCOUNTER — Ambulatory Visit (INDEPENDENT_AMBULATORY_CARE_PROVIDER_SITE_OTHER): Payer: 59 | Admitting: Medical

## 2019-11-24 ENCOUNTER — Other Ambulatory Visit: Payer: Self-pay

## 2019-11-24 VITALS — BP 119/65 | HR 84 | Wt 181.0 lb

## 2019-11-24 DIAGNOSIS — B009 Herpesviral infection, unspecified: Secondary | ICD-10-CM

## 2019-11-24 DIAGNOSIS — F419 Anxiety disorder, unspecified: Secondary | ICD-10-CM

## 2019-11-24 DIAGNOSIS — F329 Major depressive disorder, single episode, unspecified: Secondary | ICD-10-CM

## 2019-11-24 DIAGNOSIS — D563 Thalassemia minor: Secondary | ICD-10-CM

## 2019-11-24 DIAGNOSIS — O98319 Other infections with a predominantly sexual mode of transmission complicating pregnancy, unspecified trimester: Secondary | ICD-10-CM

## 2019-11-24 DIAGNOSIS — F4322 Adjustment disorder with anxiety: Secondary | ICD-10-CM

## 2019-11-24 DIAGNOSIS — N739 Female pelvic inflammatory disease, unspecified: Secondary | ICD-10-CM

## 2019-11-24 DIAGNOSIS — O99891 Other specified diseases and conditions complicating pregnancy: Secondary | ICD-10-CM

## 2019-11-24 DIAGNOSIS — N949 Unspecified condition associated with female genital organs and menstrual cycle: Secondary | ICD-10-CM

## 2019-11-24 DIAGNOSIS — Z3A14 14 weeks gestation of pregnancy: Secondary | ICD-10-CM

## 2019-11-24 DIAGNOSIS — Z825 Family history of asthma and other chronic lower respiratory diseases: Secondary | ICD-10-CM

## 2019-11-24 DIAGNOSIS — Z3492 Encounter for supervision of normal pregnancy, unspecified, second trimester: Secondary | ICD-10-CM

## 2019-11-24 DIAGNOSIS — Z148 Genetic carrier of other disease: Secondary | ICD-10-CM

## 2019-11-24 DIAGNOSIS — O99342 Other mental disorders complicating pregnancy, second trimester: Secondary | ICD-10-CM

## 2019-11-24 DIAGNOSIS — R203 Hyperesthesia: Secondary | ICD-10-CM

## 2019-11-24 DIAGNOSIS — F32A Depression, unspecified: Secondary | ICD-10-CM

## 2019-11-24 MED ORDER — COMFORT FIT MATERNITY SUPP MED MISC: 1.0000 [IU] | Freq: Every day | 0 refills | Status: DC

## 2019-11-24 NOTE — Patient Instructions (Addendum)
Second Trimester of Pregnancy  The second trimester is from week 14 through week 27 (month 4 through 6). This is often the time in pregnancy that you feel your best. Often times, morning sickness has lessened or quit. You may have more energy, and you may get hungry more often. Your unborn baby is growing rapidly. At the end of the sixth month, he or she is about 9 inches long and weighs about 1 pounds. You will likely feel the baby move between 18 and 20 weeks of pregnancy. Follow these instructions at home: Medicines  Take over-the-counter and prescription medicines only as told by your doctor. Some medicines are safe and some medicines are not safe during pregnancy.  Take a prenatal vitamin that contains at least 600 micrograms (mcg) of folic acid.  If you have trouble pooping (constipation), take medicine that will make your stool soft (stool softener) if your doctor approves. Eating and drinking   Eat regular, healthy meals.  Avoid raw meat and uncooked cheese.  If you get low calcium from the food you eat, talk to your doctor about taking a daily calcium supplement.  Avoid foods that are high in fat and sugars, such as fried and sweet foods.  If you feel sick to your stomach (nauseous) or throw up (vomit): ? Eat 4 or 5 small meals a day instead of 3 large meals. ? Try eating a few soda crackers. ? Drink liquids between meals instead of during meals.  To prevent constipation: ? Eat foods that are high in fiber, like fresh fruits and vegetables, whole grains, and beans. ? Drink enough fluids to keep your pee (urine) clear or pale yellow. Activity  Exercise only as told by your doctor. Stop exercising if you start to have cramps.  Do not exercise if it is too hot, too humid, or if you are in a place of great height (high altitude).  Avoid heavy lifting.  Wear low-heeled shoes. Sit and stand up straight.  You can continue to have sex unless your doctor tells you not  to. Relieving pain and discomfort  Wear a good support bra if your breasts are tender.  Take warm water baths (sitz baths) to soothe pain or discomfort caused by hemorrhoids. Use hemorrhoid cream if your doctor approves.  Rest with your legs raised if you have leg cramps or low back pain.  If you develop puffy, bulging veins (varicose veins) in your legs: ? Wear support hose or compression stockings as told by your doctor. ? Raise (elevate) your feet for 15 minutes, 3-4 times a day. ? Limit salt in your food. Prenatal care  Write down your questions. Take them to your prenatal visits.  Keep all your prenatal visits as told by your doctor. This is important. Safety  Wear your seat belt when driving.  Make a list of emergency phone numbers, including numbers for family, friends, the hospital, and police and fire departments. General instructions  Ask your doctor about the right foods to eat or for help finding a counselor, if you need these services.  Ask your doctor about local prenatal classes. Begin classes before month 6 of your pregnancy.  Do not use hot tubs, steam rooms, or saunas.  Do not douche or use tampons or scented sanitary pads.  Do not cross your legs for long periods of time.  Visit your dentist if you have not done so. Use a soft toothbrush to brush your teeth. Floss gently.  Avoid all smoking, herbs,   and alcohol. Avoid drugs that are not approved by your doctor.  Do not use any products that contain nicotine or tobacco, such as cigarettes and e-cigarettes. If you need help quitting, ask your doctor.  Avoid cat litter boxes and soil used by cats. These carry germs that can cause birth defects in the baby and can cause a loss of your baby (miscarriage) or stillbirth. Contact a doctor if:  You have mild cramps or pressure in your lower belly.  You have pain when you pee (urinate).  You have bad smelling fluid coming from your vagina.  You continue to  feel sick to your stomach (nauseous), throw up (vomit), or have watery poop (diarrhea).  You have a nagging pain in your belly area.  You feel dizzy. Get help right away if:  You have a fever.  You are leaking fluid from your vagina.  You have spotting or bleeding from your vagina.  You have severe belly cramping or pain.  You lose or gain weight rapidly.  You have trouble catching your breath and have chest pain.  You notice sudden or extreme puffiness (swelling) of your face, hands, ankles, feet, or legs.  You have not felt the baby move in over an hour.  You have severe headaches that do not go away when you take medicine.  You have trouble seeing. Summary  The second trimester is from week 14 through week 27 (months 4 through 6). This is often the time in pregnancy that you feel your best.  To take care of yourself and your unborn baby, you will need to eat healthy meals, take medicines only if your doctor tells you to do so, and do activities that are safe for you and your baby.  Call your doctor if you get sick or if you notice anything unusual about your pregnancy. Also, call your doctor if you need help with the right food to eat, or if you want to know what activities are safe for you. This information is not intended to replace advice given to you by your health care provider. Make sure you discuss any questions you have with your health care provider. Document Revised: 07/08/2018 Document Reviewed: 04/21/2016 Elsevier Patient Education  2020 [employer]   For belly band:  [facility]   [address], [place], [place] [address] Phone: [phone]  Monday     8:30AM-5PM Tuesday 8:30AM-5PM Wednesday 8:30AM-5PM Thursday 8:30AM-5PM Friday  8:30AM-5PM Saturday Closed Sunday Closed  AREA PEDIATRIC/FAMILY PRACTICE PHYSICIANS  Central/[place]outheast [place] [phone]) . [facility] Melodie Bouillono Chambliss, MD; Lum BabeEniola, MD; Sheffield SliderHale,  MD; Leveda AnnaHensel, MD; McDiarmid, MD; Jerene BearsMcIntyer, MD; Jennette KettleNeal, MD; Gwendolyn GrantWalden, MD o [address] [place], [place] [address] o [phone] o Mon-Fri 8:30-12:30, 1:30-5:00 o Providers come to see babies at [facility] o Accepting Medicaid . [facility] o Limited providers who accept newborns: Docia ChuckKoirala, MD; Kateri PlummerMorrow, MD; Paulino RilyWolters, MD o [address], [place], [place] [address] o [phone] o Mon-Fri 8:00-5:30 o Babies seen by providers at [facility] o Does NOT accept Medicaid o Please call early in hospitalization for appointment (limited availability)  . [facility] Fatima Sangero Mulberry, MD o [address] [place], [place] [address] o [phone] o Mon, Tue, Thur, Fri 8:30-5:00, Wed 10:00-7:00 (closed 1-2pm) o Babies seen by [facility] providers o Accepting Medicaid . Donnie Coffinubin - Pediatrician Fae Pippino Rubin, MD o [address], [place], [place] [address] o [phone]  o Mon-Fri 8:30-5:00, Sat 8:30-12:00 o Provider comes to see babies at [facility] o Accepting Medicaid o Must have been referred from current patients or contacted office prior to delivery . [facility]) Leotis Pain, MD; Ave Filter, MD; Luna Fuse, MD; Kennedy Bucker, MD; Konrad Dolores, MD; Kathlene November, MD; Jenne Campus, MD; Lubertha South, MD; Wynetta Emery, MD; Duffy Rhody, MD; Gerre Couch, NP; Shirl Harris, NP o [address] [place], [place], [place] [address] o [phone] o Mon, Tue, Thur, Fri 8:30-5:30, Wed 9:30-5:30, Sat 8:30-12:30 o Babies seen by [facility] providers o Accepting Medicaid o Only accepting infants of first-time parents or siblings of current patients [facility] discharge coordinator will make follow-up appointment . Cyril Mourning o [address], [place], [place]  [address] o [phone]   Fax - [phone] . [facility] o [address], [place], [place]  [address] o Phone - [phone]    Fax - [phone] . Lucio Edward o [address], [place], [place]  [address] o [phone]  [place]ortheast [place] [phone]) . [facility] Jorge Mandril, MD; Alita Chyle, MD; Princella Ion, MD; MD; Earlene Plater, MD; Jamesetta Orleans, MD; Alvera Novel, MD; Clarene Duke, MD; Rana Snare, MD; Carmon Ginsberg, MD; Alinda Money, MD; Hosie Poisson, MD; Mayford Knife, MD o [address], [place], [place] [address] o [phone] o Mon-Fri 8:30-5:00 (extended evenings Mon-Thur as needed), Sat-Sun 10:00-1:00 o Providers come to see babies at [facility] o Accepting Medicaid for families of first-time babies and families with all children in the household age 61 and under. Must register with office prior to making appointment (M-F only). [facility] Odella Aquas, NP; Lynelle Doctor, MD; Susann Givens, MD; [place], [place] o [address], [place], [place] [address] o [phone] o Mon-Fri 8:00-5:00 o Babies seen by providers at [facility] o Does NOT accept Medicaid/Commercial Insurance Only . [facility] [place] [facility]  Suzette Battiest, MD; Zachery Dauer, MD; Stefan Church, MD; Sabino Dick, MD; Quitman Livings, MD; Farris Has, MD; Gaynell Face, MD; Betha Loa, MD; Colon Flattery, MD; Clifton James, MD o [address] [place] [place], [place] [address] o [phone] o Mon-Fri 8:30-5:30, Sat (Oct.-Mar.) 9:00-1:00 o Babies seen by providers at [facility] o Accepting [facility] [phone]) . [facility] Dimitri, MD; Sheliah Hatch, MD o [address], [place], [place] [address] o [phone] o Mon-Fri 8:30-5:00, Sat 8:30-12:00 o Providers come to see babies at [facility] o Does NOT accept Medicaid . [facility] Cindy Hazy, [place]; [place], MD; [place], [place]; Wynelle Link, MD; Azucena Cecil, MD o [address] [place], [place] [address] o [phone] o Mon-Fri 8:00-5:00 o Babies seen by providers at [facility] o Does NOT accept Medicaid o Only accepting babies of  parents who are patients o Please call early in hospitalization for appointment (limited availability) . [facility] Pediatricians Lamar Benes, MD; Abran Cantor, MD; Early Osmond, MD; Cherre Huger, NP; Hyacinth Meeker, MD; Dwan Bolt, MD; Jarold Motto, NP; Dario Guardian, MD; Talmage Nap, MD; Maisie Fus, MD; Pricilla Holm, MD; Tama High, MD o [address], [place], [place] [address] o [phone] o Mon-Fri 8:00-5:00, Sat 9:00-12:00 o Providers come to see babies at [facility] o Does NOT accept [facility] [phone]) . [facility] o Limited providers accepting new patients: Drema Pry, NP; Delena Serve, PA o [address], [place], [place] [address] o [phone] o Mon-Fri 8:00-5:00 o Babies seen by providers at [facility] o Does NOT accept Medicaid o Only accepting babies of parents who  are patients o Please call early in hospitalization for appointment (limited availability) . [facility] Luan Pulling, MD; Nash Dimmer, MD o [address] [place] [place], [place] [address] o [phone] (press 1 to schedule appointment) o Mon-Fri 8:00-5:00 o Providers come to see babies at [facility] o Does NOT accept Medicaid . [facility] Cristino Martes, MD o [address] [place], [place] [address] o [phone] o Mon-Fri 8:30-5:00 (lunch 12:30-1:00), extended hours by appointment only Wed 5:00-6:30 o Babies seen by [facility] providers o Accepting Medicaid . [facility] Abbot, MD; [place], MD; Hassan Rowan, MD o [address] [place], [place], [place] [address] o [phone] o Mon-Fri 8:00-5:00 o Babies seen by [facility] providers o Does NOT accept Medicaid . Nature conservation officer at [place] [address] [place] Soho, MD; Durene Cal, MD; [place], DO o [address] [place], [place] [address] o [phone] o Mon-Fri 8:00-5:00 o Babies seen by [facility] providers o Does NOT accept Medicaid . [facility] o [place], [place]; [place], [place]; Christopher, NP;  Avis Epley, MD; Vonna Kotyk, MD; Clance Boll, MD; Stevphen Rochester, NP; Arvilla Market, NP; Ann Maki, NP; Otis Dials, NP; Vaughan Basta, MD; Bertrand, MD o [address] [place], [place] [address] o [phone] o Mon-Fri 8:30-5:00, Sat 10:00-1:00 o Providers come to see babies at [facility] o Does NOT accept Medicaid o Free prenatal information session Tuesdays at 4:45pm . [facility] Luna Kitchens, MD; [place], [place]; [place], [place]; [place], [place] o [address] [place] [address] o [phone] o Mon-Fri 7:30-5:30 o Babies seen by [facility] providers . [facility] o [address] [place] [address], [place], [place]  [address] o [phone]   Fax - [phone]  [place] [phone] & [phone]) . [facility] Alphonsa Overall, MD o [address] [place], [place] [address] o [phone] o Mon-Thur 8:00-6:00 o Providers come to see babies at [facility] o Accepting Medicaid . [facility] Zenon Mayo, NP; Cyndia Bent, MD; [place], [place]; [place], [place] o [address] [place], [place] [address] o [phone] o Mon-Thur 7:30-7:30, Fri 7:30-4:30 o Babies seen by [facility] providers o Accepting Medicaid . [facility] Cheryle Horsfall, MD; Janene Harvey, NP; Vonita Moss, MD o [address], [place], [place] [address] o [phone] o Mon-Fri 8:30-5:00, Sat 8:30-12:00 o Providers come to see babies at [facility] o Accepting Medicaid o Must have "Meet & Greet" appointment at office prior to delivery . [facility] [place] [facility] Llana Aliment, MD; Earlene Plater, MD; Lucretia Roers, MD o [address], [place], [place] [address] o [phone] o Mon-Wed 8:00-6:00, Thur-Fri 8:00-5:00, Sat 9:00-12:00 o Providers come to see babies at [facility] o Does NOT accept Medicaid o Only accepting siblings of current patients . [facility]  o [address],  [place], [place]  [address] o [phone]   Fax - [phone] . [facility] at [facility] o [phone] [address], [place], [place]  [address] o [phone]   Fax - [phone]  [place] [phone] & [phone]) . Nature conservation officer at [facility] o Leb[place], DO; [place], DO o [address] [place], [place] [address] o [phone] o Mon-Fri 7:00-5:00 o Babies seen by [facility] providers o Does NOT accept Medicaid . [facility] Ellis Savage, MD; [place], [place]; [place], [place] o [address], [place], [place] [address] o [phone] o Mon-Fri 8:00-5:00 o Babies seen by [facility] providers o  Accepting Medicaid . [facility] - [address] Franne Forts, MD; [place], [place]; Anita, NP; [place], [place] o [address] [place], [place], [place] [address] o [phone] o Mon-Fri 8:00-5:00 o Babies seen by providers at [facility] o Accepting [facility] [place] [phone]) . [facility] [place], [place] o [address] [place], [place] [address] o [phone] o Mon-Fri 8:00-5:00 o Babies seen by [facility] providers o Does NOT accept Medicaid o Limited availability, please call early in hospitalization to schedule follow-up . [facility] Jolee Ewing, PA; Eddie Candle, MD; [place], MD; [place], [place]; Constance Goltz, MD; [place], [place] o [address] [facility] [address], [place], [place] [address] o [phone] o Mon-Fri 8:30-5:00, Sat 9:00-12:00 o Babies seen by providers at [facility] o Accepting Medicaid o Please register online then schedule online or call office o [email or web address] . [facility]) Samuella Bruin, NP; Lucianne Muss, MD; Lanier Clam, PA o [address], [place], [place] [address] o [phone] o Mon-Fri 8:00-5:00 o Babies seen by providers at [facility] o Accepting Medicaid . [facility] ([facility]) Sharin Mons, MD; Reed Breech, NP; Shelva Majestic, MD o [address], [place], [place] [address] o [phone] o Mon-Fri 8:00-5:30, Sat&Sun by appointment (phones open at 8:30) o Babies seen by [facility] providers o Accepting Medicaid o Must be a first-time baby or sibling of current patient . [facility] [address], [place], [place]  [address] o [phone]   Fax - [phone]  [place] [phone] & [phone]) . [facility] o [place], [place]; [place], [place]; Dimple Casey, MD; [place], [place]; Carolyne Fiscal, MD o [address], [place], [place] [address] o [phone] o Mon-Thur 8:00-7:00, Fri 8:00-5:00, Sat 8:00-12:00, Sun 9:00-12:00 o Babies seen by [facility] providers o Accepting Medicaid . [facility] at [facility], MD; Gaynell Face, MD; [facility], MD o [address], [place], [place] [address] o [phone] o Mon-Thur 8:00-5:00 o Babies seen by providers at [facility] o Accepting Medicaid . [facility] [place] Gwenlyn Saran, MD; [place], MD; Madilyn Fireman, MD; Melvyn Neth, MD; List, MD; Lazarus Salines, MD; Gaynell Face, MD; Berneda Rose, MD; Flora Lipps, MD; Beryl Meager, MD; Luther Redo, MD; Lavonia Drafts, MD; Kellie Simmering, MD o [address] [place] [place], [place] [address] o [phone] o Mon-Fri 8:00-5:30, Sat (Oct.-Mar.) 9:00-1:00 o Babies seen by providers at [facility] o Accepting Medicaid o Must fill out new patient packet, available online at [email or web address] . [facility] - [place] [facility] at [facility]) Simone Curia, NP; Tiburcio Pea, NP; Tresa Endo, NP; Whitney Post, MD; [place], [place]; Hennie Duos, MD; Wynne Dust, MD; Kavin Leech, NP o [address], [place], [place] [address] o [phone] o Mon-Thur 8:00-5:30, Fri 8:00-5:00 o Babies seen by providers  at [facility] o Accepting [facility] [phone]) . [facility] Medicine o [place], [place]; [place], MD; Tanya Nones, MD; [place], [place] o [address] [place], [place] [address] o [phone] o Mon-Fri 8:00-5:00 o Babies seen by providers at [facility] o Accepting [facility] [phone]) . [facility] at [facility] o [place], DO; Lenise Arena, MD; [place], [place] o [address], [place], [place] [address] o [phone] o Mon-Fri 8:00-5:00 o Babies seen by  providers at [facility] o Does NOT accept Medicaid o Limited appointment availability, please call early in hospitalization  . Nature conservation officer at [facility] o [place], DO; [place], MD o [address] [place], [place] [address] o [phone] o Mon-Fri 8:00-5:00 o Babies seen by [facility] providers o Does NOT accept Medicaid . [facility] Lorrine Kin, MD; Ninetta Lights, MD; [place], [place]; [place], MD o [address], [place], [place] [address] o [phone] o Mon-Fri 8:00-5:00 o After hours clinic [facility] [place], [place] [address]) [phone] Mon-Fri 5:00-8:00, Sat 12:00-6:00, Sun 10:00-4:00 o Babies seen by [facility] providers o Accepting Medicaid . [facility] at [facility] o [address], [place], [place]  [address] o [phone]   Fax - [phone]  [place] [phone]) . Nature conservation officer at [facility], MD o [address] [place], [place], [place] [address] o [phone] o Mon-Fri 8:00-5:00 o Babies seen by [facility] providers o Does NOT accept Medicaid . [facility] - [place] [facility] at [place]) Tomi Likens, MD o [address] [place] [address], [place], [place] [address] o [phone] o Mon-Thur 8:00-7:00, Fri 8:00-5:00, Sat 8:00-12:00 o Babies seen by providers at [facility] o Accepting Medicaid - but does not have vaccinations in office (must be  received elsewhere) o Limited availability, please call early in hospitalization  [place] ([phone]) . [facility]  o Wyvonne Lenz, MD o [address], [place] [address] o [phone]  Fax [phone]

## 2019-11-24 NOTE — Progress Notes (Signed)
   PRENATAL VISIT NOTE  Subjective:  Amanda Kerr is a 29 y.o. G2P0010 at [redacted]w[redacted]d being seen today for ongoing prenatal care.  She is currently monitored for the following issues for this high-risk pregnancy and has Anxiety and depression; History of asthma; Allodynia; HSV (herpes simplex virus); Supervision of low-risk pregnancy; Female pelvic inflammatory disease; Adjustment disorder; and Alpha thalassemia silent carrier on their problem list.  Patient reports nausea and round ligament pain.  Contractions: Not present. Vag. Bleeding: None.  Movement: Absent. Denies leaking of fluid.   The following portions of the patient's history were reviewed and updated as appropriate: allergies, current medications, past family history, past medical history, past social history, past surgical history and problem list.   Objective:   Vitals:   11/24/19 0938  BP: 119/65  Pulse: 84  Weight: 181 lb (82.1 kg)    Fetal Status: Fetal Heart Rate (bpm): 154   Movement: Absent     General:  Alert, oriented and cooperative. Patient is in no acute distress.  Skin: Skin is warm and dry. No rash noted.   Cardiovascular: Normal heart rate noted  Respiratory: Normal respiratory effort, no problems with respiration noted  Abdomen: Soft, gravid, appropriate for gestational age.  Pain/Pressure: Present     Pelvic: Cervical exam deferred        Extremities: Normal range of motion.  Edema: None  Mental Status: Normal mood and affect. Normal behavior. Normal judgment and thought content.   Assessment and Plan:  Pregnancy: G2P0010 at [redacted]w[redacted]d 1. Encounter for supervision of low-risk pregnancy in second trimester - Still unsure of MOC - Peds list given and importance of deciding by 36 weeks discussed  - Patient had first dose of COVID vaccine 11/12/19 - Anticipatory guidance for upcoming visits discussed, plan for AFP at next visit  - Anatomy US scheduled 12/25/19  2. Anxiety and depression  3. HSV (herpes  simplex virus) - On valtrex  4. Alpha thalassemia silent carrier - Discussed results, patient will consider Genetic Counseling and partner testing with partner and let us know  5. [redacted] weeks gestation of pregnancy  6. Round ligament pain - Elastic Bandages & Supports (COMFORT FIT MATERNITY SUPP MED) MISC; 1 Units by Does not apply route daily.  Dispense: 1 each; Refill: 0  Preterm labor/second trimester warning symptoms and general obstetric precautions including but not limited to vaginal bleeding, contractions, leaking of fluid and fetal movement were reviewed in detail with the patient. Please refer to After Visit Summary for other counseling recommendations.   Return in about 1 month (around 12/25/2019) for LOB, Virtual.  Future Appointments  Date Time Provider Department Center  12/25/2019  7:45 AM [facility] US[facility]    Vonzella Nipple, PA-C

## 2019-12-05 ENCOUNTER — Encounter: Payer: Self-pay | Admitting: Medical

## 2019-12-12 ENCOUNTER — Encounter: Payer: Self-pay | Admitting: General Practice

## 2019-12-13 ENCOUNTER — Other Ambulatory Visit: Payer: Self-pay

## 2019-12-13 ENCOUNTER — Other Ambulatory Visit: Payer: 59

## 2019-12-13 DIAGNOSIS — Z20822 Contact with and (suspected) exposure to covid-19: Secondary | ICD-10-CM

## 2019-12-16 LAB — NOVEL CORONAVIRUS, NAA: SARS-CoV-2, NAA: NOT DETECTED

## 2019-12-22 ENCOUNTER — Telehealth (INDEPENDENT_AMBULATORY_CARE_PROVIDER_SITE_OTHER): Payer: 59 | Admitting: Medical

## 2019-12-22 ENCOUNTER — Encounter: Payer: Self-pay | Admitting: Medical

## 2019-12-22 ENCOUNTER — Other Ambulatory Visit: Payer: Self-pay

## 2019-12-22 VITALS — BP 125/80 | HR 92 | Wt 183.6 lb

## 2019-12-22 DIAGNOSIS — D563 Thalassemia minor: Secondary | ICD-10-CM

## 2019-12-22 DIAGNOSIS — Z3492 Encounter for supervision of normal pregnancy, unspecified, second trimester: Secondary | ICD-10-CM

## 2019-12-22 DIAGNOSIS — Z3A18 18 weeks gestation of pregnancy: Secondary | ICD-10-CM

## 2019-12-22 DIAGNOSIS — B009 Herpesviral infection, unspecified: Secondary | ICD-10-CM

## 2019-12-22 NOTE — Progress Notes (Signed)
I connected with Amanda Kerr 12/22/19 at  9:15 AM EDT by: MyChart video and verified that I am speaking with the correct person using two identifiers.  Patient is located at home and provider is located at [facility].     The purpose of this virtual visit is to provide medical care while limiting exposure to the novel coronavirus. I discussed the limitations, risks, security and privacy concerns of performing an evaluation and management service by MyChart video and the availability of in person appointments. I also discussed with the patient that there may be a patient responsible charge related to this service. By engaging in this virtual visit, you consent to the provision of healthcare.  Additionally, you authorize for your insurance to be billed for the services provided during this visit.  The patient expressed understanding and agreed to proceed.  The following staff members participated in the virtual visit:  Corinda Gubler, CMA    PRENATAL VISIT NOTE  Subjective:  Amanda Kerr is a 29 y.o. G2P0010 at [redacted]w[redacted]d  for phone visit for ongoing prenatal care.  She is currently monitored for the following issues for this low-risk pregnancy and has Anxiety and depression; History of asthma; Allodynia; HSV (herpes simplex virus); Supervision of low-risk pregnancy; Female pelvic inflammatory disease; Adjustment disorder; and Alpha thalassemia silent carrier on their problem list.  Patient reports pelvic pain, intermittent.  Contractions: Not present. Vag. Bleeding: None.  Movement: Present. Denies leaking of fluid.   The following portions of the patient's history were reviewed and updated as appropriate: allergies, current medications, past family history, past medical history, past social history, past surgical history and problem list.   Objective:   Vitals:   12/22/19 0916  BP: 125/80  Pulse: 92  Weight: 183 lb 9.6 oz (83.3 kg)   Self-Obtained  Fetal Status:     Movement: Present      Assessment and Plan:  Pregnancy: G2P0010 at [redacted]w[redacted]d 1. Alpha thalassemia silent carrier - declined partner testing and genetic counseling   2. Encounter for supervision of low-risk pregnancy in second trimester - Discussed AFP at [place] visit  3. HSV (herpes simplex virus) - PPx at 35 weeks  4. [redacted] weeks gestation of pregnancy  Preterm labor symptoms and general obstetric precautions including but not limited to vaginal bleeding, contractions, leaking of fluid and fetal movement were reviewed in detail with the patient.  Return in about 4 weeks (around 01/19/2020) for LOB, Virtual.  Future Appointments  Date Time Provider Department Center  12/25/2019  7:45 AM [facility] US4 [facility]     Time spent on virtual visit: 10 minutes  Vonzella Nipple, PA-C

## 2019-12-22 NOTE — Patient Instructions (Signed)

## 2019-12-25 ENCOUNTER — Other Ambulatory Visit: Payer: Self-pay | Admitting: Medical

## 2019-12-25 ENCOUNTER — Other Ambulatory Visit: Payer: Self-pay

## 2019-12-25 ENCOUNTER — Ambulatory Visit: Payer: 59 | Attending: Medical

## 2019-12-25 ENCOUNTER — Other Ambulatory Visit: Payer: Self-pay | Admitting: *Deleted

## 2019-12-25 DIAGNOSIS — Z362 Encounter for other antenatal screening follow-up: Secondary | ICD-10-CM

## 2019-12-25 DIAGNOSIS — Z3490 Encounter for supervision of normal pregnancy, unspecified, unspecified trimester: Secondary | ICD-10-CM | POA: Insufficient documentation

## 2020-01-19 ENCOUNTER — Encounter: Payer: Self-pay | Admitting: Advanced Practice Midwife

## 2020-01-19 ENCOUNTER — Telehealth (INDEPENDENT_AMBULATORY_CARE_PROVIDER_SITE_OTHER): Payer: 59 | Admitting: Advanced Practice Midwife

## 2020-01-19 VITALS — BP 131/81 | HR 96

## 2020-01-19 DIAGNOSIS — Z148 Genetic carrier of other disease: Secondary | ICD-10-CM

## 2020-01-19 DIAGNOSIS — F419 Anxiety disorder, unspecified: Secondary | ICD-10-CM

## 2020-01-19 DIAGNOSIS — B009 Herpesviral infection, unspecified: Secondary | ICD-10-CM

## 2020-01-19 DIAGNOSIS — D563 Thalassemia minor: Secondary | ICD-10-CM

## 2020-01-19 DIAGNOSIS — Z3492 Encounter for supervision of normal pregnancy, unspecified, second trimester: Secondary | ICD-10-CM

## 2020-01-19 DIAGNOSIS — O98312 Other infections with a predominantly sexual mode of transmission complicating pregnancy, second trimester: Secondary | ICD-10-CM

## 2020-01-19 DIAGNOSIS — F4323 Adjustment disorder with mixed anxiety and depressed mood: Secondary | ICD-10-CM

## 2020-01-19 DIAGNOSIS — O99342 Other mental disorders complicating pregnancy, second trimester: Secondary | ICD-10-CM

## 2020-01-19 DIAGNOSIS — Z3A22 22 weeks gestation of pregnancy: Secondary | ICD-10-CM

## 2020-01-19 DIAGNOSIS — F329 Major depressive disorder, single episode, unspecified: Secondary | ICD-10-CM

## 2020-01-19 NOTE — Progress Notes (Signed)
I connected with  Amanda Kerr on 01/19/20 at  8:15 AM EDT by telephone and verified that I am speaking with the correct person using two identifiers.   I discussed the limitations, risks, security and privacy concerns of performing an evaluation and management service by telephone and the availability of in person appointments. I also discussed with the patient that there may be a patient responsible charge related to this service. The patient expressed understanding and agreed to proceed.  Janene Madeira Klani Caridi, CMA 01/19/2020  8:17 AM

## 2020-01-19 NOTE — Progress Notes (Signed)
Patient ID: Amanda Kerr, female   DOB: January 13, 1991, 29 y.o.   MRN: [id number]    TELEHEALTH OBSTETRICS VISIT ENCOUNTER NOTE  I connected with Amanda Kerr on 01/19/20 at  8:15 AM EDT by MyChart video at home and verified that I am speaking with the correct person using two identifiers.   Patient is at home and provider is in the office   I discussed the limitations, risks, security and privacy concerns of performing an evaluation and management service by telephone and the availability of in person appointments. I also discussed with the patient that there may be a patient responsible charge related to this service. The patient expressed understanding and agreed to proceed.  Subjective:  Amanda Kerr is a 29 y.o. G2P0010 at [redacted]w[redacted]d being followed for ongoing prenatal care.  She is currently monitored for the following issues for this low-risk pregnancy and has Anxiety and depression; History of asthma; Allodynia; HSV (herpes simplex virus); Supervision of low-risk pregnancy; Female pelvic inflammatory disease; Adjustment disorder; and Alpha thalassemia silent carrier on their problem list.  Patient reports no complaints. Reports fetal movement. Denies any contractions, bleeding or leaking of fluid.   The following portions of the patient's history were reviewed and updated as appropriate: allergies, current medications, past family history, past medical history, past social history, past surgical history and problem list.   Objective:   General:  Alert, oriented and cooperative.   Mental Status: Normal mood and affect perceived. Normal judgment and thought content.  Rest of physical exam deferred due to type of encounter  Assessment and Plan:  Pregnancy: G2P0010 at [redacted]w[redacted]d 1. Alpha thalassemia silent carrier - Declined partner testing   2. Encounter for supervision of low-risk pregnancy in second trimester - routine care - GTT at next visit   3. [redacted] weeks gestation of  pregnancy   Preterm labor symptoms and general obstetric precautions including but not limited to vaginal bleeding, contractions, leaking of fluid and fetal movement were reviewed in detail with the patient.  I discussed the assessment and treatment plan with the patient. The patient was provided an opportunity to ask questions and all were answered. The patient agreed with the plan and demonstrated an understanding of the instructions. The patient was advised to call back or seek an in-person office evaluation/go to MAU at [facility] for any urgent or concerning symptoms. Please refer to After Visit Summary for other counseling recommendations.   I provided 12 minutes of non-face-to-face time during this encounter.  Return in about 6 weeks (around 03/01/2020) for in person visit with 2 hour GTT and 28 week labs .  Future Appointments  Date Time Provider Department Center  01/22/2020  1:00 PM [facility] NURSE [facility]  01/22/2020  1:15 PM [facility] US2 [facility]   Thressa Sheller DNP, CNM  01/19/20  8:24 AM

## 2020-01-22 ENCOUNTER — Other Ambulatory Visit: Payer: Self-pay

## 2020-01-22 ENCOUNTER — Encounter: Payer: Self-pay | Admitting: *Deleted

## 2020-01-22 ENCOUNTER — Ambulatory Visit: Payer: 59 | Attending: Obstetrics and Gynecology

## 2020-01-22 ENCOUNTER — Ambulatory Visit: Payer: 59 | Admitting: *Deleted

## 2020-01-22 DIAGNOSIS — Z362 Encounter for other antenatal screening follow-up: Secondary | ICD-10-CM | POA: Diagnosis not present

## 2020-01-22 DIAGNOSIS — D563 Thalassemia minor: Secondary | ICD-10-CM | POA: Insufficient documentation

## 2020-01-22 DIAGNOSIS — Z148 Genetic carrier of other disease: Secondary | ICD-10-CM

## 2020-01-22 DIAGNOSIS — O99512 Diseases of the respiratory system complicating pregnancy, second trimester: Secondary | ICD-10-CM | POA: Diagnosis not present

## 2020-01-22 DIAGNOSIS — Z3A23 23 weeks gestation of pregnancy: Secondary | ICD-10-CM

## 2020-01-22 DIAGNOSIS — J45909 Unspecified asthma, uncomplicated: Secondary | ICD-10-CM

## 2020-02-27 ENCOUNTER — Other Ambulatory Visit: Payer: Self-pay | Admitting: *Deleted

## 2020-02-27 DIAGNOSIS — Z3490 Encounter for supervision of normal pregnancy, unspecified, unspecified trimester: Secondary | ICD-10-CM

## 2020-02-29 ENCOUNTER — Ambulatory Visit (INDEPENDENT_AMBULATORY_CARE_PROVIDER_SITE_OTHER): Payer: 59 | Admitting: Nurse Practitioner

## 2020-02-29 ENCOUNTER — Other Ambulatory Visit: Payer: 59

## 2020-02-29 ENCOUNTER — Other Ambulatory Visit: Payer: Self-pay

## 2020-02-29 VITALS — BP 122/67 | HR 90 | Wt 204.0 lb

## 2020-02-29 DIAGNOSIS — Z3490 Encounter for supervision of normal pregnancy, unspecified, unspecified trimester: Secondary | ICD-10-CM

## 2020-02-29 DIAGNOSIS — Z23 Encounter for immunization: Secondary | ICD-10-CM

## 2020-02-29 DIAGNOSIS — O26843 Uterine size-date discrepancy, third trimester: Secondary | ICD-10-CM

## 2020-02-29 DIAGNOSIS — Z3493 Encounter for supervision of normal pregnancy, unspecified, third trimester: Secondary | ICD-10-CM

## 2020-02-29 DIAGNOSIS — Z3492 Encounter for supervision of normal pregnancy, unspecified, second trimester: Secondary | ICD-10-CM

## 2020-02-29 DIAGNOSIS — Z3A28 28 weeks gestation of pregnancy: Secondary | ICD-10-CM

## 2020-02-29 NOTE — Progress Notes (Signed)
    Subjective:  Amanda Kerr is a 29 y.o. G2P0010 at [redacted]w[redacted]d being seen today for ongoing prenatal care.  She is currently monitored for the following issues for this low-risk pregnancy and has Anxiety and depression; History of asthma; Allodynia; HSV (herpes simplex virus); Supervision of low-risk pregnancy; Female pelvic inflammatory disease; Adjustment disorder; and Alpha thalassemia silent carrier on their problem list.  Patient reports Some vaginal and low perlvic pain at night, wearing maternity support belt.  Contractions: Not present. Vag. Bleeding: None.  Movement: Present. Denies leaking of fluid.   The following portions of the patient's history were reviewed and updated as appropriate: allergies, current medications, past family history, past medical history, past social history, past surgical history and problem list. Problem list updated.  Objective:   Vitals:   02/29/20 0841  BP: 122/67  Pulse: 90  Weight: 204 lb (92.5 kg)    Fetal Status: Fetal Heart Rate (bpm): 156 Fundal Height: 32 cm Movement: Present     General:  Alert, oriented and cooperative. Patient is in no acute distress.  Skin: Skin is warm and dry. No rash noted.   Cardiovascular: Normal heart rate noted  Respiratory: Normal respiratory effort, no problems with respiration noted  Abdomen: Soft, gravid, appropriate for gestational age. Pain/Pressure: Present     Pelvic:  Cervical exam deferred        Extremities: Normal range of motion.  Edema: None  Mental Status: Normal mood and affect. Normal behavior. Normal judgment and thought content.   Urinalysis:      Assessment and Plan:  Pregnancy: G2P0010 at [redacted]w[redacted]d  1. Encounter for supervision of low-risk pregnancy in third trimester 28 week labs done Encouraged signing up for childbirth and breastfeeding classes Reports depression and anxiety are better and not a problem at this time Is using the maternity support belt Reviewed taking BP weekly and  adding BP to babyscripts  - Tdap vaccine greater than or equal to 7yo IM  2. Significant discrepancy between uterine size and clinical dates, antepartum, third trimester Will continue to observe for now and will consider [place] if consistently elevated  Preterm labor symptoms and general obstetric precautions including but not limited to vaginal bleeding, contractions, leaking of fluid and fetal movement were reviewed in detail with the patient. Please refer to After Visit Summary for other counseling recommendations.  Return in about 2 weeks (around 03/14/2020) for in person ROB.  Nolene Bernheim, RN, MSN, NP-BC Nurse Practitioner, [facility] for [employer], [facility] 02/29/2020 9:05 AM

## 2020-02-29 NOTE — Patient Instructions (Addendum)
[email or web address] for childbirth and breastfeeding classes      Third Trimester of Pregnancy  The third trimester is from week 28 through week 40 (months 7 through 9). This trimester is when your unborn baby (fetus) is growing very fast. At the end of the ninth month, the unborn baby is about 20 inches in length. It weighs about 6-10 pounds. Follow these instructions at home: Medicines  Take over-the-counter and prescription medicines only as told by your doctor. Some medicines are safe and some medicines are not safe during pregnancy.  Take a prenatal vitamin that contains at least 600 micrograms (mcg) of folic acid.  If you have trouble pooping (constipation), take medicine that will make your stool soft (stool softener) if your doctor approves. Eating and drinking   Eat regular, healthy meals.  Avoid raw meat and uncooked cheese.  If you get low calcium from the food you eat, talk to your doctor about taking a daily calcium supplement.  Eat four or five small meals rather than three large meals a day.  Avoid foods that are high in fat and sugars, such as fried and sweet foods.  To prevent constipation: ? Eat foods that are high in fiber, like fresh fruits and vegetables, whole grains, and beans. ? Drink enough fluids to keep your pee (urine) clear or pale yellow. Activity  Exercise only as told by your doctor. Stop exercising if you start to have cramps.  Avoid heavy lifting, wear low heels, and sit up straight.  Do not exercise if it is too hot, too humid, or if you are in a place of great height (high altitude).  You may continue to have sex unless your doctor tells you not to. Relieving pain and discomfort  Wear a good support bra if your breasts are tender.  Take frequent breaks and rest with your legs raised if you have leg cramps or low back pain.  Take warm water baths (sitz baths) to soothe pain or discomfort caused by hemorrhoids. Use hemorrhoid cream if  your doctor approves.  If you develop puffy, bulging veins (varicose veins) in your legs: ? Wear support hose or compression stockings as told by your doctor. ? Raise (elevate) your feet for 15 minutes, 3-4 times a day. ? Limit salt in your food. Safety  Wear your seat belt when driving.  Make a list of emergency phone numbers, including numbers for family, friends, the hospital, and police and fire departments. Preparing for your baby's arrival To prepare for the arrival of your baby:  Take prenatal classes.  Practice driving to the hospital.  Visit the hospital and tour the maternity area.  Talk to your work about taking leave once the baby comes.  Pack your hospital bag.  Prepare the baby's room.  Go to your doctor visits.  Buy a rear-facing car seat. Learn how to install it in your car. General instructions  Do not use hot tubs, steam rooms, or saunas.  Do not use any products that contain nicotine or tobacco, such as cigarettes and e-cigarettes. If you need help quitting, ask your doctor.  Do not drink alcohol.  Do not douche or use tampons or scented sanitary pads.  Do not cross your legs for long periods of time.  Do not travel for long distances unless you must. Only do so if your doctor says it is okay.  Visit your dentist if you have not gone during your pregnancy. Use a soft toothbrush to brush your teeth. Be  gentle when you floss.  Avoid cat litter boxes and soil used by cats. These carry germs that can cause birth defects in the baby and can cause a loss of your baby (miscarriage) or stillbirth.  Keep all your prenatal visits as told by your doctor. This is important. Contact a doctor if:  You are not sure if you are in labor or if your water has broken.  You are dizzy.  You have mild cramps or pressure in your lower belly.  You have a nagging pain in your belly area.  You continue to feel sick to your stomach, you throw up, or you have watery  poop.  You have bad smelling fluid coming from your vagina.  You have pain when you pee. Get help right away if:  You have a fever.  You are leaking fluid from your vagina.  You are spotting or bleeding from your vagina.  You have severe belly cramps or pain.  You lose or gain weight quickly.  You have trouble catching your breath and have chest pain.  You notice sudden or extreme puffiness (swelling) of your face, hands, ankles, feet, or legs.  You have not felt the baby move in over an hour.  You have severe headaches that do not go away with medicine.  You have trouble seeing.  You are leaking, or you are having a gush of fluid, from your vagina before you are 37 weeks.  You have regular belly spasms (contractions) before you are 37 weeks. Summary  The third trimester is from week 28 through week 40 (months 7 through 9). This time is when your unborn baby is growing very fast.  Follow your doctor's advice about medicine, food, and activity.  Get ready for the arrival of your baby by taking prenatal classes, getting all the baby items ready, preparing the baby's room, and visiting your doctor to be checked.  Get help right away if you are bleeding from your vagina, or you have chest pain and trouble catching your breath, or if you have not felt your baby move in over an hour. This information is not intended to replace advice given to you by your health care provider. Make sure you discuss any questions you have with your health care provider. Document Revised: 07/07/2018 Document Reviewed: 04/21/2016 Elsevier Patient Education  2020 [employer].

## 2020-03-01 LAB — GLUCOSE TOLERANCE, 2 HOURS W/ 1HR
Glucose, 1 hour: 95 mg/dL (ref 65–179)
Glucose, 2 hour: 94 mg/dL (ref 65–152)
Glucose, Fasting: 72 mg/dL (ref 65–91)

## 2020-03-06 LAB — CBC
Hematocrit: 34.7 % (ref 34.0–46.6)
Hemoglobin: 11.2 g/dL (ref 11.1–15.9)
MCH: 27.9 pg (ref 26.6–33.0)
MCHC: 32.3 g/dL (ref 31.5–35.7)
MCV: 86 fL (ref 79–97)
Platelets: 349 10*3/uL (ref 150–450)
RBC: 4.02 x10E6/uL (ref 3.77–5.28)
RDW: 13.9 % (ref 11.7–15.4)
WBC: 8.8 10*3/uL (ref 3.4–10.8)

## 2020-03-06 LAB — AFP, SERUM, OPEN SPINA BIFIDA
AFP Value: 174.2 ng/mL
Gest. Age on Collection Date: 19 weeks
Maternal Age At EDD: 30.7 yr

## 2020-03-06 LAB — RPR: RPR Ser Ql: NONREACTIVE

## 2020-03-06 LAB — HIV ANTIBODY (ROUTINE TESTING W REFLEX): HIV Screen 4th Generation wRfx: NONREACTIVE

## 2020-03-14 ENCOUNTER — Ambulatory Visit (INDEPENDENT_AMBULATORY_CARE_PROVIDER_SITE_OTHER): Payer: 59 | Admitting: Women's Health

## 2020-03-14 ENCOUNTER — Other Ambulatory Visit: Payer: Self-pay

## 2020-03-14 VITALS — BP 114/79 | HR 108 | Wt 199.0 lb

## 2020-03-14 DIAGNOSIS — O26899 Other specified pregnancy related conditions, unspecified trimester: Secondary | ICD-10-CM

## 2020-03-14 DIAGNOSIS — Z23 Encounter for immunization: Secondary | ICD-10-CM | POA: Diagnosis not present

## 2020-03-14 DIAGNOSIS — Z3493 Encounter for supervision of normal pregnancy, unspecified, third trimester: Secondary | ICD-10-CM

## 2020-03-14 DIAGNOSIS — Z6791 Unspecified blood type, Rh negative: Secondary | ICD-10-CM

## 2020-03-14 DIAGNOSIS — B009 Herpesviral infection, unspecified: Secondary | ICD-10-CM

## 2020-03-14 DIAGNOSIS — O26843 Uterine size-date discrepancy, third trimester: Secondary | ICD-10-CM

## 2020-03-14 MED ORDER — RHO D IMMUNE GLOBULIN 1500 UNIT/2ML IJ SOSY
300.0000 ug | PREFILLED_SYRINGE | Freq: Once | INTRAMUSCULAR | Status: AC
  Administered 2020-03-14: 300 ug via INTRAMUSCULAR

## 2020-03-14 NOTE — Progress Notes (Signed)
Subjective:  Amanda Kerr is a 29 y.o. G2P0010 at [redacted]w[redacted]d being seen today for ongoing prenatal care.  She is currently monitored for the following issues for this low-risk pregnancy and has Anxiety and depression; History of asthma; Allodynia; HSV (herpes simplex virus); Supervision of low-risk pregnancy; Female pelvic inflammatory disease; Adjustment disorder; Alpha thalassemia silent carrier; and Rh negative state in antepartum period on their problem list.  Patient reports no complaints.  Contractions: Not present. Vag. Bleeding: None.  Movement: Present. Denies leaking of fluid.   The following portions of the patient's history were reviewed and updated as appropriate: allergies, current medications, past family history, past medical history, past social history, past surgical history and problem list. Problem list updated.  Objective:   Vitals:   03/14/20 1038  BP: 114/79  Pulse: (!) 108  Weight: 199 lb (90.3 kg)    Fetal Status: Fetal Heart Rate (bpm): 165 Fundal Height: 33 cm Movement: Present     General:  Alert, oriented and cooperative. Patient is in no acute distress.  Skin: Skin is warm and dry. No rash noted.   Cardiovascular: Normal heart rate noted  Respiratory: Normal respiratory effort, no problems with respiration noted  Abdomen: Soft, gravid, appropriate for gestational age. Pain/Pressure: Present     Pelvic: Vag. Bleeding: None     Cervical exam deferred        Extremities: Normal range of motion.  Edema: None  Mental Status: Normal mood and affect. Normal behavior. Normal judgment and thought content.   Urinalysis:      Assessment and Plan:  Pregnancy: G2P0010 at [redacted]w[redacted]d  1. Encounter for supervision of low-risk pregnancy in third trimester - discussed flu vaccine, pt considering - discussed contraception, information given - pt to [place] for holidays, discussed travel in pregnancy and information given, advised hydration, leg exercises in car, getting  out q1-2 hours to walk/stretch, requesting a copy of paper OB records to bring to [place] and noting where closest hospital with L&D department is located in case of emergency  2. HSV (herpes simplex virus) -suppressive therapy 35/36 weeks  3. Rh negative state in antepartum period - rho (d) immune globulin (RHIG/RHOPHYLAC) injection 300 mcg - Antibody screen  4. Uterine size-date discrepancy in third trimester - [place] MFM OB FOLLOW UP; Future   Preterm labor symptoms and general obstetric precautions including but not limited to vaginal bleeding, contractions, leaking of fluid and fetal movement were reviewed in detail with the patient. I discussed the assessment and treatment plan with the patient. The patient was provided an opportunity to ask questions and all were answered. The patient agreed with the plan and demonstrated an understanding of the instructions. The patient was advised to call back or seek an in-person office evaluation/go to MAU at [facility] for any urgent or concerning symptoms. Please refer to After Visit Summary for other counseling recommendations.  Return in about 2 weeks (around 03/28/2020) for LOB/APP OK, needs growth [place].   Rashelle Ireland, Odie Sera, NP

## 2020-03-14 NOTE — Patient Instructions (Addendum)
Maternity Assessment Unit (MAU)  The Maternity Assessment Unit (MAU) is located at the [facility] and [place] [facility]. The address is: [address] [place], [place], [place] [address]. Please see map below for additional directions.    The Maternity Assessment Unit is designed to help you during your pregnancy, and for up to 6 weeks after delivery, with any pregnancy- or postpartum-related emergencies, if you think you are in labor, or if your water has broken. For example, if you experience nausea and vomiting, vaginal bleeding, severe abdominal or pelvic pain, elevated blood pressure or other problems related to your pregnancy or postpartum time, please come to the Maternity Assessment Unit for assistance.        Preterm Labor and Birth Information  The normal length of a pregnancy is 39-41 weeks. Preterm labor is when labor starts before 37 completed weeks of pregnancy. What are the risk factors for preterm labor? Preterm labor is more likely to occur in women who:  Have certain infections during pregnancy such as a bladder infection, sexually transmitted infection, or infection inside the uterus (chorioamnionitis).  Have a shorter-than-normal cervix.  Have gone into preterm labor before.  Have had surgery on their cervix.  Are younger than age 64 or older than age 26.  Are African American.  Are pregnant with twins or multiple babies (multiple gestation).  Take street drugs or smoke while pregnant.  Do not gain enough weight while pregnant.  Became pregnant shortly after having been pregnant. What are the symptoms of preterm labor? Symptoms of preterm labor include:  Cramps similar to those that can happen during a menstrual period. The cramps may happen with diarrhea.  Pain in the abdomen or lower back.  Regular uterine contractions that may feel like tightening of the abdomen.  A feeling of increased pressure in the  pelvis.  Increased watery or bloody mucus discharge from the vagina.  Water breaking (ruptured amniotic sac). Why is it important to recognize signs of preterm labor? It is important to recognize signs of preterm labor because babies who are born prematurely may not be fully developed. This can put them at an increased risk for:  Long-term (chronic) heart and lung problems.  Difficulty immediately after birth with regulating body systems, including blood sugar, body temperature, heart rate, and breathing rate.  Bleeding in the brain.  Cerebral palsy.  Learning difficulties.  Death. These risks are highest for babies who are born before 38 weeks of pregnancy. How is preterm labor treated? Treatment depends on the length of your pregnancy, your condition, and the health of your baby. It may involve:  Having a stitch (suture) placed in your cervix to prevent your cervix from opening too early (cerclage).  Taking or being given medicines, such as: ? Hormone medicines. These may be given early in pregnancy to help support the pregnancy. ? Medicine to stop contractions. ? Medicines to help mature the baby's lungs. These may be prescribed if the risk of delivery is high. ? Medicines to prevent your baby from developing cerebral palsy. If the labor happens before 34 weeks of pregnancy, you may need to stay in the hospital. What should I do if I think I am in preterm labor? If you think that you are going into preterm labor, call your health care provider right away. How can I prevent preterm labor in future pregnancies? To increase your chance of having a full-term pregnancy:  Do not use any tobacco products, such as  cigarettes, chewing tobacco, and e-cigarettes. If you need help quitting, ask your health care provider.  Do not use street drugs or medicines that have not been prescribed to you during your pregnancy.  Talk with your health care provider before taking any herbal  supplements, even if you have been taking them regularly.  Make sure you gain a healthy amount of weight during your pregnancy.  Watch for infection. If you think that you might have an infection, get it checked right away.  Make sure to tell your health care provider if you have gone into preterm labor before. This information is not intended to replace advice given to you by your health care provider. Make sure you discuss any questions you have with your health care provider. Document Revised: 07/08/2018 Document Reviewed: 08/07/2015 Elsevier Patient Education  2020 Elsevier Inc.       Rh0 [D] Immune Globulin injection What is this medicine? RhO [D] IMMUNE GLOBULIN (i MYOON GLOB yoo lin) is used to treat idiopathic thrombocytopenic purpura (ITP). This medicine is used in RhO negative mothers who are pregnant with a RhO positive child. It is also used after a transfusion of RhO positive blood into a RhO negative person. This medicine may be used for other purposes; ask your health care provider or pharmacist if you have questions. COMMON BRAND NAME(S): BayRho-D, HyperRHO S/D, MICRhoGAM, RhoGAM, Rhophylac, WinRho SDF What should I tell my health care provider before I take this medicine? They need to know if you have any of these conditions:  bleeding disorders  low levels of immunoglobulin A in the body  no spleen  an unusual or allergic reaction to human immune globulin, other medicines, foods, dyes, or preservatives  pregnant or trying to get pregnant  breast-feeding How should I use this medicine? This medicine is for injection into a muscle or into a vein. It is given by a health care professional in a hospital or clinic setting. Talk to your pediatrician regarding the use of this medicine in children. This medicine is not approved for use in children. Overdosage: If you think you have taken too much of this medicine contact a poison control center or emergency room at  once. NOTE: This medicine is only for you. Do not share this medicine with others. What if I miss a dose? It is important not to miss your dose. Call your doctor or health care professional if you are unable to keep an appointment. What may interact with this medicine?  live virus vaccines, like measles, mumps, or rubella This list may not describe all possible interactions. Give your health care provider a list of all the medicines, herbs, non-prescription drugs, or dietary supplements you use. Also tell them if you smoke, drink alcohol, or use illegal drugs. Some items may interact with your medicine. What should I watch for while using this medicine? This medicine is made from human blood. It may be possible to pass an infection in this medicine. Talk to your doctor about the risks and benefits of this medicine. This medicine may interfere with live virus vaccines. Before you get live virus vaccines tell your health care professional if you have received this medicine within the past 3 months. What side effects may I notice from receiving this medicine? Side effects that you should report to your doctor or health care professional as soon as possible:  allergic reactions like skin rash, itching or hives, swelling of the face, lips, or tongue  breathing problems  chest pain  or tightness  yellowing of the eyes or skin Side effects that usually do not require medical attention (report to your doctor or health care professional if they continue or are bothersome):  fever  pain and tenderness at site where injected This list may not describe all possible side effects. Call your doctor for medical advice about side effects. You may report side effects to FDA at 1-800-FDA-1088. Where should I keep my medicine? This drug is given in a hospital or clinic and will not be stored at home. NOTE: This sheet is a summary. It may not cover all possible information. If you have questions about this  medicine, talk to your doctor, pharmacist, or health care provider.  2020 Elsevier/Gold Standard (2007-11-14 14:06:10)        Pregnancy and Influenza  Influenza, also called the flu, is an infection of the lungs and airways (respiratory tract). If you are pregnant, you are more likely to catch the flu. You are also more likely to have a more serious case of the flu. This is because pregnancy causes changes to your body's disease-fighting system (immune system), heart, and lungs. If you develop a bad case of the flu, especially with a high fever, this can cause problems for you and your developing baby. How do people get the flu? The flu is caused by a type of germ called a virus. It spreads when virus particles get passed from person to person by:  Being near a sick person who is coughing or sneezing.  Touching something that has the virus on it and then touching your mouth, nose, or face. The influenza virus is most common during the fall and winter. How can I protect myself against the flu?  Get a flu shot. The best way to prevent the flu is to get a flu shot before flu season starts. The flu shot is not dangerous for your developing baby. It may even help protect your baby from the flu for up to 6 months after birth.  Wash your hands often with soap and warm water. If soap and water are not available, use hand sanitizer.  Do not come in close contact with sick people.  Do not share food, drinks, or utensils with other people.  Avoid touching your eyes, nose, and mouth.  Clean frequently used surfaces at home, school, or work.  Practice healthy lifestyle habits, such as: ? Eating a healthy, balanced diet. ? Drinking plenty of fluids. ? Exercising regularly or as told by your health care provider. ? Sleeping 7-9 hours each night. ? Finding ways to manage stress. What should I do if I have flu symptoms?  If you have any symptoms of the flu, even after getting a flu shot,  contact your health care provider right away.  To reduce fever, take over-the-counter acetaminophen as told by your health care provider.  If you have the flu, you may get antiviral medicine to keep the flu from becoming severe and to shorten how long it lasts.  Avoid spreading the flu to others: ? Stay home until you are well. ? Cover your nose and mouth when you cough or sneeze. ? Wash your hands often. Follow these instructions at home:  Take over-the-counter and prescription medicines only as told by your health care provider. Do not take any medicine, including cold or flu medicine, unless your health care provider tells you to do so.  If you were prescribed antiviral medicine, take it as told by your health care  provider. Do not stop taking the antiviral medicine even if you start to feel better.  Eat a nutrient-rich diet that includes fresh fruits and vegetables, whole grains, lean protein, and low-fat dairy.  Drink enough fluid to keep your urine clear or pale yellow.  Get plenty of rest. Contact a health care provider if:  You have fever or chills.  You have a cough, sore throat, or stuffy nose.  You have worsening or unusual: ? Muscle aches. ? Headache. ? Tiredness. ? Loss of appetite.  You have vomiting or diarrhea. Get help right away if:  You have trouble breathing.  You have chest pain.  You have abdominal pain.  You begin to have labor pains.  You have a fever that does not go down 24 hours after you take medicine.  You do not feel your baby move.  You have diarrhea or vomiting that will not go away.  You have dizziness or confusion.  Your symptoms do not improve, even with treatment. Summary  If you are pregnant, you are more likely to catch the flu. You are also more likely to have a more serious case of the flu.  If you have flu-like symptoms, call your health care provider right away. If you develop a bad case of the flu, especially with a  high fever, this can be dangerous for your developing baby.  The best way to prevent the flu is to get a flu shot before flu season starts. The flu shot is not dangerous for your developing baby.  If you have the flu and were prescribed antiviral medicine, take it as told by your health care provider. This information is not intended to replace advice given to you by your health care provider. Make sure you discuss any questions you have with your health care provider. Document Revised: 07/08/2018 Document Reviewed: 05/12/2016 Elsevier Patient Education  2020 [employer].        Contraception Choices Contraception, also called birth control, refers to methods or devices that prevent pregnancy. Hormonal methods Contraceptive implant  A contraceptive implant is a thin, plastic tube that contains a hormone. It is inserted into the upper part of the arm. It can remain in place for up to 3 years. Progestin-only injections Progestin-only injections are injections of progestin, a synthetic form of the hormone progesterone. They are given every 3 months by a health care provider. Birth control pills  Birth control pills are pills that contain hormones that prevent pregnancy. They must be taken once a day, preferably at the same time each day. Birth control patch  The birth control patch contains hormones that prevent pregnancy. It is placed on the skin and must be changed once a week for three weeks and removed on the fourth week. A prescription is needed to use this method of contraception. Vaginal ring  A vaginal ring contains hormones that prevent pregnancy. It is placed in the vagina for three weeks and removed on the fourth week. After that, the process is repeated with a new ring. A prescription is needed to use this method of contraception. Emergency contraceptive Emergency contraceptives prevent pregnancy after unprotected sex. They come in pill form and can be taken up to 5 days  after sex. They work best the sooner they are taken after having sex. Most emergency contraceptives are available without a prescription. This method should not be used as your only form of birth control. Barrier methods Female condom  A female condom is a thin sheath  that is worn over the penis during sex. Condoms keep sperm from going inside a woman's body. They can be used with a spermicide to increase their effectiveness. They should be disposed after a single use. Female condom  A female condom is a soft, loose-fitting sheath that is put into the vagina before sex. The condom keeps sperm from going inside a woman's body. They should be disposed after a single use. Diaphragm  A diaphragm is a soft, dome-shaped barrier. It is inserted into the vagina before sex, along with a spermicide. The diaphragm blocks sperm from entering the uterus, and the spermicide kills sperm. A diaphragm should be left in the vagina for 6-8 hours after sex and removed within 24 hours. A diaphragm is prescribed and fitted by a health care provider. A diaphragm should be replaced every 1-2 years, after giving birth, after gaining more than 15 lb (6.8 kg), and after pelvic surgery. Cervical cap  A cervical cap is a round, soft latex or plastic cup that fits over the cervix. It is inserted into the vagina before sex, along with spermicide. It blocks sperm from entering the uterus. The cap should be left in place for 6-8 hours after sex and removed within 48 hours. A cervical cap must be prescribed and fitted by a health care provider. It should be replaced every 2 years. Sponge  A sponge is a soft, circular piece of polyurethane foam with spermicide on it. The sponge helps block sperm from entering the uterus, and the spermicide kills sperm. To use it, you make it wet and then insert it into the vagina. It should be inserted before sex, left in for at least 6 hours after sex, and removed and thrown away within 30  hours. Spermicides Spermicides are chemicals that kill or block sperm from entering the cervix and uterus. They can come as a cream, jelly, suppository, foam, or tablet. A spermicide should be inserted into the vagina with an applicator at least 10-15 minutes before sex to allow time for it to work. The process must be repeated every time you have sex. Spermicides do not require a prescription. Intrauterine contraception Intrauterine device (IUD) An IUD is a T-shaped device that is put in a woman's uterus. There are two types:  Hormone IUD.This type contains progestin, a synthetic form of the hormone progesterone. This type can stay in place for 3-5 years.  Copper IUD.This type is wrapped in copper wire. It can stay in place for 10 years.  Permanent methods of contraception Female tubal ligation In this method, a woman's fallopian tubes are sealed, tied, or blocked during surgery to prevent eggs from traveling to the uterus. Hysteroscopic sterilization In this method, a small, flexible insert is placed into each fallopian tube. The inserts cause scar tissue to form in the fallopian tubes and block them, so sperm cannot reach an egg. The procedure takes about 3 months to be effective. Another form of birth control must be used during those 3 months. Female sterilization This is a procedure to tie off the tubes that carry sperm (vasectomy). After the procedure, the man can still ejaculate fluid (semen). Natural planning methods Natural family planning In this method, a couple does not have sex on days when the woman could become pregnant. Calendar method This means keeping track of the length of each menstrual cycle, identifying the days when pregnancy can happen, and not having sex on those days. Ovulation method In this method, a couple avoids sex during ovulation.  Symptothermal method This method involves not having sex during ovulation. The woman typically checks for ovulation by watching  changes in her temperature and in the consistency of cervical mucus. Post-ovulation method In this method, a couple waits to have sex until after ovulation. Summary  Contraception, also called birth control, means methods or devices that prevent pregnancy.  Hormonal methods of contraception include implants, injections, pills, patches, vaginal rings, and emergency contraceptives.  Barrier methods of contraception can include female condoms, female condoms, diaphragms, cervical caps, sponges, and spermicides.  There are two types of IUDs (intrauterine devices). An IUD can be put in a woman's uterus to prevent pregnancy for 3-5 years.  Permanent sterilization can be done through a procedure for males, females, or both.  Natural family planning methods involve not having sex on days when the woman could become pregnant. This information is not intended to replace advice given to you by your health care provider. Make sure you discuss any questions you have with your health care provider. Document Revised: 03/18/2017 Document Reviewed: 04/18/2016 Elsevier Patient Education  2020 [employer].        Pregnancy and Travel  Most pregnant women can safely travel until the last month of their pregnancy. Your doctor may recommend limiting or avoiding travel depending on how far you are in the pregnancy, and if you have any medical or pregnancy problems. General travel tips Before you go:  Discuss your trip with your doctor. Get examined shortly before you go.  Get a copy of your medical records. Take it with you.  Try to get names of doctors and hospitals in the area where you will be visiting.  Pack your pillow.  Pack any approved medicines and supplements.  Get enough sleep the night before the trip. During your trip:  Ask for locations of doctors and hospitals.  Wear flat, comfortable shoes.  Wear loose-fitting, comfortable clothes.  Wear compression stockings as told by  your doctor. They may prevent blood clots that arise from sitting for a long time.  Do leg exercises as told by your doctor.  Eat a balanced diet, drink lots of fluid, and take your vitamins and supplements.  Take water, crackers, and fruit with you.  Take breaks to use the restroom and walk every 2 hours or during stops.  Do not wear yourself out.  Do not ride on a motorcycle.  Rest. If your trip is long, lie down for 30 or more minutes with your feet slightly raised after you reach your destination.  Always wear a seat belt. Tips for traveling to a foreign country Before you go:  Ask your doctor if there are medicines that are safe for you to take if you get diarrhea, constipation, nausea, or vomiting.  Check with your health insurance provider about medical coverage abroad. Purchase travel [employer], if needed.  Make sure you are up to date on vaccines. During your trip:  Do not eat uncooked foods.  Do not eat food from buffets or food that is cold or sitting at room temperature.  Drink bottled beverages and water. Do not use ice.  Wash fruits and vegetables with clean water. If possible, peel them before eating.  Do not drink unpasteurized milk.  Wear insect repellent if there are mosquitoes or other biting insects. Ask your doctor which repellents are safe. What do I need to know about traveling by car?  Wear your seat belt properly. The belt should be buckled below your abdomen, on your  hip bones. The shoulder belt should be off to the side of your abdomen and across the center of your chest.  If you are in the front seat, sit as far away from the dashboard as possible to avoid getting hit hard if the airbag deploys in an accident.  Do not travel for more than 5-6 hours a day. What do I need to know about traveling by bus?  Before making a reservation, ask whether your bus will have a restroom.  Move your arms and legs when seated.  If you have to use  the restroom, hold on to the seats and handrails as you walk.  Do not travel for more than 5-6 hours a day. What do I need to know about traveling by train?  Before making a reservation, ask if your train will have a sleeping car and more than one restroom.  If you need to walk while the train is moving, hold on to seats and handrails.  Move your arms and legs when seated.  Do not travel for more than 5-6 hours a day. What do I need to know about traveling by airplane?  Before booking your trip, ask about the airline's rules about pregnancy. Pregnant women may be restricted from flying after a certain time of the pregnancy. Every airline has its own rules.  Make sure you complete your trip before 36 weeks of pregnancy.  Ask whether the airplane cabin will be pressurized. Do not board an unpressurized plane that will fly above 7,000 ft (2,100 m).  Try to get a bulkhead or an aisle seat so it is easier to get up, stretch, and use the bathroom.  Wear layers since the cabin temperature can change.  Put all your medicines and medical records in your carry-on bag.  Avoid drinking caffeinated or carbonated beverages.  Avoid eating foods that may make you bloated.  Do not eat a big meal.  If you need to walk through the airplane, hold on to the seats and handrails.  Move your arms and legs when seated.  Wear your seat belt. What do I need to know about traveling by cruise ship?  Before booking your trip, ask the cruise ship company: ? Are pregnant women allowed on the ship? ? Is there a medical facility and doctor on board? ? Does the ship dock in places where there are doctors and medical facilities?  Before booking your trip, ask your doctor: ? Is it safe to take medicines if I get seasick? ? Is it safe to wear acupressure wristbands to prevent seasickness? If the answer is yes, consider buying one. Contact a health care provider if:  You have diarrhea.  You vomit.  You  have nausea or seasickness. Get help right away if:  You have vaginal bleeding.  You have severe vomiting or diarrhea.  You have pelvic or abdominal pain.  You have contractions.  Your water breaks.  You have a persistent headache.  Your eyesight changes or you see spots.  Your face or hands are swollen.  You have pain, warmth, or swelling in your legs or ankles. Summary  Most pregnant women can safely travel until the last month of their pregnancy. Your doctor may tell you to limit or avoid travel depending on how far you are in your pregnancy and if you have any medical or pregnancy problems.  The best time to travel is between 14 and 28 weeks of your pregnancy.  Before you go on your trip,  make sure you discuss your trip with your health care provider, get a copy of your medical records, and try to get information on medical centers and doctors at your destination.  While on your trip, make sure you wear comfortable clothes and shoes, eat a healthy diet, drink plenty of fluids, take your vitamins and supplements, take breaks and rest often, and wear your seat belt.  Before booking a flight, ask about the airline's rules about pregnancy. Every airline has its own rules. Do the same for a train, bus, or cruise ship. This information is not intended to replace advice given to you by your health care provider. Make sure you discuss any questions you have with your health care provider. Document Revised: 08/30/2018 Document Reviewed: 04/21/2016 Elsevier Patient Education  2020 [employer].

## 2020-03-14 NOTE — Addendum Note (Signed)
Addended by: Kathee Delton on: 03/14/2020 11:21 AM   Modules accepted: Orders

## 2020-03-15 LAB — ANTIBODY SCREEN: Antibody Screen: NEGATIVE

## 2020-03-18 ENCOUNTER — Encounter: Payer: Self-pay | Admitting: *Deleted

## 2020-03-18 ENCOUNTER — Other Ambulatory Visit: Payer: Self-pay

## 2020-03-18 ENCOUNTER — Ambulatory Visit: Payer: 59 | Admitting: *Deleted

## 2020-03-18 ENCOUNTER — Ambulatory Visit: Payer: 59 | Attending: Obstetrics and Gynecology

## 2020-03-18 DIAGNOSIS — Z148 Genetic carrier of other disease: Secondary | ICD-10-CM

## 2020-03-18 DIAGNOSIS — O26899 Other specified pregnancy related conditions, unspecified trimester: Secondary | ICD-10-CM | POA: Diagnosis present

## 2020-03-18 DIAGNOSIS — J45909 Unspecified asthma, uncomplicated: Secondary | ICD-10-CM

## 2020-03-18 DIAGNOSIS — Z6791 Unspecified blood type, Rh negative: Secondary | ICD-10-CM | POA: Diagnosis present

## 2020-03-18 DIAGNOSIS — D563 Thalassemia minor: Secondary | ICD-10-CM

## 2020-03-18 DIAGNOSIS — O99513 Diseases of the respiratory system complicating pregnancy, third trimester: Secondary | ICD-10-CM

## 2020-03-18 DIAGNOSIS — O360130 Maternal care for anti-D [Rh] antibodies, third trimester, not applicable or unspecified: Secondary | ICD-10-CM | POA: Diagnosis not present

## 2020-03-18 DIAGNOSIS — O26843 Uterine size-date discrepancy, third trimester: Secondary | ICD-10-CM | POA: Insufficient documentation

## 2020-03-18 DIAGNOSIS — Z362 Encounter for other antenatal screening follow-up: Secondary | ICD-10-CM

## 2020-03-18 DIAGNOSIS — Z3A31 31 weeks gestation of pregnancy: Secondary | ICD-10-CM

## 2020-03-19 ENCOUNTER — Encounter: Payer: Self-pay | Admitting: Women's Health

## 2020-03-19 DIAGNOSIS — O26849 Uterine size-date discrepancy, unspecified trimester: Secondary | ICD-10-CM | POA: Insufficient documentation

## 2020-03-30 NOTE — L&D Delivery Note (Addendum)
[facility] Delivery Note  Amanda Kerr is a 31 y.o. G2P0010 s/p SVD at [redacted]w[redacted]d. She was admitted for SOL.   ROM: Spontaneous 1h 68m with clear fluid GBS Status: Positive, received only one full dose of penicillin (2nd dose started, but not completed) Maximum Maternal Temperature: 99.4  Labor Progress: . Presented with spontaneous onset of labor.  Had SROM of blood-tinged fluid at 2200, likely forebag.  After receiving epidural, had some hypotension and recurrent variable decelerations, but improved with dose of phenylephrine and repositioning.  Had SROM of larger amount of clear fluid at 2315.  Progressed to complete without further complication.   Delivery Date/Time: 05/13/2020 at 0109 Delivery: Called to room and patient was complete and pushing. Head delivered ROA. No nuchal cord present. Shoulder and body delivered in usual fashion. Infant with spontaneous cry, placed on mother's abdomen, dried and stimulated. Cord clamped x 2 after 1-minute delay, and cut by FOB under my direct supervision. Cord blood drawn. Placenta delivered spontaneously with gentle cord traction. Fundus firm with massage and Pitocin. Labia, perineum, vagina, and cervix were inspected, found to have bilateral labial lacerations.   Placenta: Intact, 3 vessel cord Complications: None Lacerations: Bilateral labial lacerations.  Right was repaired with 4-0 Vicryl.  Left was hemostatic and did not require repair.   EBL: 50 cc Analgesia: Epidural  Infant: Viable female  APGARs 9 & 9  weight pending   Amanda Demarco M [employer], MD 05/13/2020, 1:25 AM

## 2020-04-01 ENCOUNTER — Other Ambulatory Visit: Payer: Self-pay

## 2020-04-01 ENCOUNTER — Ambulatory Visit (INDEPENDENT_AMBULATORY_CARE_PROVIDER_SITE_OTHER): Payer: 59 | Admitting: Nurse Practitioner

## 2020-04-01 VITALS — BP 117/71 | HR 120 | Wt 206.3 lb

## 2020-04-01 DIAGNOSIS — Z3A33 33 weeks gestation of pregnancy: Secondary | ICD-10-CM

## 2020-04-01 DIAGNOSIS — B009 Herpesviral infection, unspecified: Secondary | ICD-10-CM

## 2020-04-01 DIAGNOSIS — Z3493 Encounter for supervision of normal pregnancy, unspecified, third trimester: Secondary | ICD-10-CM

## 2020-04-01 DIAGNOSIS — R059 Cough, unspecified: Secondary | ICD-10-CM

## 2020-04-01 MED ORDER — VALACYCLOVIR HCL 500 MG PO TABS: 500.0000 mg | ORAL_TABLET | Freq: Two times a day (BID) | ORAL | 1 refills | Status: DC

## 2020-04-01 NOTE — Patient Instructions (Addendum)
Intrauterine Device Information An intrauterine device (IUD) is a medical device that is inserted in the uterus to prevent pregnancy. It is a small, T-shaped device that has one or two nylon strings hanging down from it. The strings hang out of the lower part of the uterus (cervix) to allow for future IUD removal. There are two types of IUDs available:  Hormone IUD. This type of IUD is made of plastic and contains the hormone progestin (synthetic progesterone). A hormone IUD may last 3-5 years.  Copper IUD. This type of IUD has copper wire wrapped around it. A copper IUD may last up to 10 years. How is an IUD inserted? An IUD is inserted through the vagina and placed into the uterus with a minor medical procedure. The exact procedure for IUD insertion may vary among health care providers and hospitals. How does an IUD work? Synthetic progesterone in a hormonal IUD prevents pregnancy by:  Thickening cervical mucus to prevent sperm from entering the uterus.  Thinning the uterine lining to prevent a fertilized egg from being implanted there. Copper in a copper IUD prevents pregnancy by making the uterus and fallopian tubes produce a fluid that kills sperm. What are the advantages of an IUD? Advantages of either type of IUD  It is highly effective in preventing pregnancy.  It is reversible. You can become pregnant shortly after the IUD is removed.  It is low-maintenance and can stay in place for a long time.  There are no estrogen-related side effects.  It can be used when breastfeeding.  It is not associated with weight gain.  It can be inserted right after childbirth, an abortion, or a miscarriage. Advantages of a hormone IUD  If it is inserted within 7 days of your period starting, it works right after it is inserted. If the hormone IUD is inserted at any other time in your cycle, you will need to use a backup method of birth control for 7 days after insertion.  It can make  menstrual periods lighter.  It can reduce menstrual cramping.  It can be used for 3-5 years. Advantages of a copper IUD  It works right after it is inserted.  It can be used as a form of emergency birth control if it is inserted within 5 days after having unprotected sex.  It does not interfere with your body's natural hormones.  It can be used for 10 years. What are the disadvantages of an IUD?  An IUD may cause irregular menstrual bleeding for a period of time after insertion.  You may have pain during insertion and have cramping and vaginal bleeding after insertion.  An IUD may cut the uterus (uterine perforation) when it is inserted. This is rare.  An IUD may cause pelvic inflammatory disease (PID), which is an infection in the uterus and fallopian tubes. This is rare, and it usually happens during the first 20 days after the IUD is inserted.  A copper IUD can make your menstrual flow heavier and more painful. How is an IUD removed?  You will lie on your back with your knees bent and your feet in footrests (stirrups).  A device will be inserted into your vagina to spread apart the vaginal walls (speculum). This will allow your health care provider to see the strings attached to the IUD.  Your health care provider will use a small instrument (forceps) to grasp the IUD strings and pull firmly until the IUD is removed. You may have some discomfort   when the IUD is removed. Your health care provider may recommend taking over-the-counter pain relievers, such as ibuprofen, before the procedure. You may also have minor spotting for a few days after the procedure. The exact procedure for IUD removal may vary among health care providers and hospitals. Is the IUD right for me? Your health care provider will make sure you are a good candidate for an IUD and will discuss the advantages, disadvantages, and possible side effects with you. Summary  An intrauterine device (IUD) is a medical  device that is inserted in the uterus to prevent pregnancy. It is a small, T-shaped device that has one or two nylon strings hanging down from it.  A hormone IUD contains the hormone progestin (synthetic progesterone). A copper IUD has copper wire wrapped around it.  Synthetic progesterone in a hormone IUD prevents pregnancy by thickening cervical mucus and thinning the walls of the uterus. Copper in a copper IUD prevents pregnancy by making the uterus and fallopian tubes produce a fluid that kills sperm.  A hormone IUD can be left in place for 3-5 years. A copper IUD can be left in place for up to 10 years.  An IUD is inserted and removed by a health care provider. You may feel some pain during insertion and removal. Your health care provider may recommend taking over-the-counter pain medicine, such as ibuprofen, before an IUD procedure. This information is not intended to replace advice given to you by your health care provider. Make sure you discuss any questions you have with your health care provider. Document Revised: 02/26/2017 Document Reviewed: 04/14/2016 Elsevier Patient Education  2020 [employer].  Intrauterine Device Information An intrauterine device (IUD) is a medical device that is inserted in the uterus to prevent pregnancy. It is a small, T-shaped device that has one or two nylon strings hanging down from it. The strings hang out of the lower part of the uterus (cervix) to allow for future IUD removal. There are two types of IUDs available:  Hormone IUD. This type of IUD is made of plastic and contains the hormone progestin (synthetic progesterone). A hormone IUD may last 3-5 years.  Copper IUD. This type of IUD has copper wire wrapped around it. A copper IUD may last up to 10 years. How is an IUD inserted? An IUD is inserted through the vagina and placed into the uterus with a minor medical procedure. The exact procedure for IUD insertion may vary among health care providers  and hospitals. How does an IUD work? Synthetic progesterone in a hormonal IUD prevents pregnancy by:  Thickening cervical mucus to prevent sperm from entering the uterus.  Thinning the uterine lining to prevent a fertilized egg from being implanted there. Copper in a copper IUD prevents pregnancy by making the uterus and fallopian tubes produce a fluid that kills sperm. What are the advantages of an IUD? Advantages of either type of IUD  It is highly effective in preventing pregnancy.  It is reversible. You can become pregnant shortly after the IUD is removed.  It is low-maintenance and can stay in place for a long time.  There are no estrogen-related side effects.  It can be used when breastfeeding.  It is not associated with weight gain.  It can be inserted right after childbirth, an abortion, or a miscarriage. Advantages of a hormone IUD  If it is inserted within 7 days of your period starting, it works right after it is inserted. If the hormone IUD is  inserted at any other time in your cycle, you will need to use a backup method of birth control for 7 days after insertion.  It can make menstrual periods lighter.  It can reduce menstrual cramping.  It can be used for 3-5 years. Advantages of a copper IUD  It works right after it is inserted.  It can be used as a form of emergency birth control if it is inserted within 5 days after having unprotected sex.  It does not interfere with your body's natural hormones.  It can be used for 10 years. What are the disadvantages of an IUD?  An IUD may cause irregular menstrual bleeding for a period of time after insertion.  You may have pain during insertion and have cramping and vaginal bleeding after insertion.  An IUD may cut the uterus (uterine perforation) when it is inserted. This is rare.  An IUD may cause pelvic inflammatory disease (PID), which is an infection in the uterus and fallopian tubes. This is rare, and it  usually happens during the first 20 days after the IUD is inserted.  A copper IUD can make your menstrual flow heavier and more painful. How is an IUD removed?  You will lie on your back with your knees bent and your feet in footrests (stirrups).  A device will be inserted into your vagina to spread apart the vaginal walls (speculum). This will allow your health care provider to see the strings attached to the IUD.  Your health care provider will use a small instrument (forceps) to grasp the IUD strings and pull firmly until the IUD is removed. You may have some discomfort when the IUD is removed. Your health care provider may recommend taking over-the-counter pain relievers, such as ibuprofen, before the procedure. You may also have minor spotting for a few days after the procedure. The exact procedure for IUD removal may vary among health care providers and hospitals. Is the IUD right for me? Your health care provider will make sure you are a good candidate for an IUD and will discuss the advantages, disadvantages, and possible side effects with you. Summary  An intrauterine device (IUD) is a medical device that is inserted in the uterus to prevent pregnancy. It is a small, T-shaped device that has one or two nylon strings hanging down from it.  A hormone IUD contains the hormone progestin (synthetic progesterone). A copper IUD has copper wire wrapped around it.  Synthetic progesterone in a hormone IUD prevents pregnancy by thickening cervical mucus and thinning the walls of the uterus. Copper in a copper IUD prevents pregnancy by making the uterus and fallopian tubes produce a fluid that kills sperm.  A hormone IUD can be left in place for 3-5 years. A copper IUD can be left in place for up to 10 years.  An IUD is inserted and removed by a health care provider. You may feel some pain during insertion and removal. Your health care provider may recommend taking over-the-counter pain  medicine, such as ibuprofen, before an IUD procedure. This information is not intended to replace advice given to you by your health care provider. Make sure you discuss any questions you have with your health care provider. Document Revised: 02/26/2017 Document Reviewed: 04/14/2016 Elsevier Patient Education  2020 [employer].   Safe Medications in Pregnancy   Acne: Benzoyl Peroxide Salicylic Acid  Backache/Headache: Tylenol: 2 regular strength every 4 hours OR  2 Extra strength every 6 hours  Colds/Coughs/Allergies: Benadryl (alcohol free) 25 mg every 6 hours as needed Breath right strips Claritin Cepacol throat lozenges Chloraseptic throat spray Cold-Eeze- up to three times per day Cough drops, alcohol free Flonase (by prescription only) Guaifenesin Mucinex Robitussin DM (plain only, alcohol free) Saline nasal spray/drops Sudafed (pseudoephedrine) & Actifed ** use only after [redacted] weeks gestation and if you do not have high blood pressure Tylenol Vicks Vaporub Zinc lozenges Zyrtec   Constipation: Colace Ducolax suppositories Fleet enema Glycerin suppositories Metamucil Milk of magnesia Miralax Senokot Smooth move tea  Diarrhea: Kaopectate Imodium A-D  *NO pepto Bismol  Hemorrhoids: Anusol Anusol HC Preparation H Tucks  Indigestion: Tums Maalox Mylanta Zantac  Pepcid  Insomnia: Benadryl (alcohol free) 25mg  every 6 hours as needed Tylenol PM Unisom, no Gelcaps  Leg Cramps: Tums MagGel  Nausea/Vomiting:  Bonine Dramamine Emetrol Ginger extract Sea bands Meclizine  Nausea medication to take during pregnancy:  Unisom (doxylamine succinate 25 mg tablets) Take one tablet daily at bedtime. If symptoms are not adequately controlled, the dose can be increased to a maximum recommended dose of two tablets daily (1/2 tablet in the morning, 1/2 tablet mid-afternoon and one at bedtime). Vitamin B6 100mg  tablets. Take one tablet twice a  day (up to 200 mg per day).  Skin Rashes: Aveeno products Benadryl cream or 25mg  every 6 hours as needed Calamine Lotion 1% cortisone cream  Yeast infection: Gyne-lotrimin 7 Monistat 7   **If taking multiple medications, please check labels to avoid duplicating the same active ingredients **take medication as directed on the label ** Do not exceed 4000 mg of tylenol in 24 hours **Do not take medications that contain aspirin or ibuprofen

## 2020-04-01 NOTE — Progress Notes (Signed)
    Subjective:  Amanda Kerr is a 30 y.o. G2P0010 at [redacted]w[redacted]d being seen today for ongoing prenatal care.  She is currently monitored for the following issues for this low-risk pregnancy and has Anxiety and depression; History of asthma; Allodynia; HSV (herpes simplex virus); Supervision of low-risk pregnancy; Female pelvic inflammatory disease; Adjustment disorder; Alpha thalassemia silent carrier; Rh negative state in antepartum period; and Uterine size date discrepancy on their problem list.  Patient reports no complaints.  Contractions: Not present. Vag. Bleeding: None.  Movement: Present. Denies leaking of fluid.   The following portions of the patient's history were reviewed and updated as appropriate: allergies, current medications, past family history, past medical history, past social history, past surgical history and problem list. Problem list updated.  Objective:   Vitals:   04/01/20 0929  BP: 117/71  Pulse: (!) 120  Weight: 206 lb 4.8 oz (93.6 kg)    Fetal Status: Fetal Heart Rate (bpm): 167 Fundal Height: 34 cm Movement: Present     General:  Alert, oriented and cooperative. Patient is in no acute distress.  Skin: Skin is warm and dry. No rash noted.   Cardiovascular: Normal heart rate noted  Respiratory: Normal respiratory effort, no problems with respiration noted  Abdomen: Soft, gravid, appropriate for gestational age. Pain/Pressure: Present     Pelvic:  Cervical exam deferred        Extremities: Normal range of motion.  Edema: Trace  Mental Status: Normal mood and affect. Normal behavior. Normal judgment and thought content.   Urinalysis:      Assessment and Plan:  Pregnancy: G2P0010 at [redacted]w[redacted]d  1. Encounter for supervision of low-risk pregnancy in third trimester Doing well Given info on IUD to consider for pp contraception  2. Cough  Has had cough and no shortness of breath Is fully covid vaccinated Family member was sick and is awaiting Covid results -  she has been around the family member and has the same symptoms - assume she is likely Covid positive due to illness Advised to do remote work (which she can do) until her symptoms resolve  3. HSV (herpes simplex virus) Start suppression next week  Preterm labor symptoms and general obstetric precautions including but not limited to vaginal bleeding, contractions, leaking of fluid and fetal movement were reviewed in detail with the patient. Please refer to After Visit Summary for other counseling recommendations.  Return in about 2 weeks (around 04/15/2020).  Nolene Bernheim, RN, MSN, NP-BC Nurse Practitioner, [facility] for [employer], [facility] 04/01/2020 12:23 PM

## 2020-04-05 ENCOUNTER — Other Ambulatory Visit: Payer: 59

## 2020-04-11 ENCOUNTER — Other Ambulatory Visit: Payer: 59

## 2020-04-11 DIAGNOSIS — Z20822 Contact with and (suspected) exposure to covid-19: Secondary | ICD-10-CM

## 2020-04-16 ENCOUNTER — Other Ambulatory Visit: Payer: Self-pay

## 2020-04-16 ENCOUNTER — Telehealth (INDEPENDENT_AMBULATORY_CARE_PROVIDER_SITE_OTHER): Payer: 59 | Admitting: Advanced Practice Midwife

## 2020-04-16 ENCOUNTER — Encounter: Payer: 59 | Admitting: Advanced Practice Midwife

## 2020-04-16 VITALS — BP 137/84 | HR 99

## 2020-04-16 DIAGNOSIS — O99343 Other mental disorders complicating pregnancy, third trimester: Secondary | ICD-10-CM

## 2020-04-16 DIAGNOSIS — F32A Depression, unspecified: Secondary | ICD-10-CM

## 2020-04-16 DIAGNOSIS — O98513 Other viral diseases complicating pregnancy, third trimester: Secondary | ICD-10-CM

## 2020-04-16 DIAGNOSIS — O99013 Anemia complicating pregnancy, third trimester: Secondary | ICD-10-CM

## 2020-04-16 DIAGNOSIS — Z3A35 35 weeks gestation of pregnancy: Secondary | ICD-10-CM

## 2020-04-16 DIAGNOSIS — O26899 Other specified pregnancy related conditions, unspecified trimester: Secondary | ICD-10-CM

## 2020-04-16 DIAGNOSIS — B009 Herpesviral infection, unspecified: Secondary | ICD-10-CM

## 2020-04-16 DIAGNOSIS — D563 Thalassemia minor: Secondary | ICD-10-CM

## 2020-04-16 DIAGNOSIS — F419 Anxiety disorder, unspecified: Secondary | ICD-10-CM

## 2020-04-16 DIAGNOSIS — Z3493 Encounter for supervision of normal pregnancy, unspecified, third trimester: Secondary | ICD-10-CM

## 2020-04-16 DIAGNOSIS — O26843 Uterine size-date discrepancy, third trimester: Secondary | ICD-10-CM

## 2020-04-16 DIAGNOSIS — O360130 Maternal care for anti-D [Rh] antibodies, third trimester, not applicable or unspecified: Secondary | ICD-10-CM

## 2020-04-16 LAB — NOVEL CORONAVIRUS, NAA: SARS-CoV-2, NAA: DETECTED — AB

## 2020-04-16 NOTE — Patient Instructions (Signed)

## 2020-04-16 NOTE — Progress Notes (Signed)
I connected with  Judson Roch on 04/16/20 at 0957 by MyChart and verified that I am speaking with the correct person using two identifiers.   I discussed the limitations, risks, security and privacy concerns of performing an evaluation and management service by telephone and the availability of in person appointments. I also discussed with the patient that there may be a patient responsible charge related to this service. The patient expressed understanding and agreed to proceed.  Marjo Bicker, RN 04/16/2020  9:55 AM

## 2020-04-16 NOTE — Progress Notes (Signed)
OBSTETRICS PRENATAL VIRTUAL VISIT ENCOUNTER NOTE  [facility]   I connected with Judson RochSerina Ashley Demilio on 04/16/20 at 10:00 AM EST by MyChart Video Encounter at home and verified that I am speaking with the correct person using two identifiers.   I discussed the limitations, risks, security and privacy concerns of performing an evaluation and management service virtually and the availability of in person appointments. I also discussed with the patient that there may be a patient responsible charge related to this service. The patient expressed understanding and agreed to proceed. Subjective:  Judson RochSerina Ashley Bartle is a 30 y.o. G2P0010 at 5832w1d being seen today for ongoing prenatal care.  She is currently monitored for the following issues for this low-risk pregnancy and has Anxiety and depression; History of asthma; Allodynia; HSV (herpes simplex virus); Supervision of low-risk pregnancy; Female pelvic inflammatory disease; Adjustment disorder; Alpha thalassemia silent carrier; Rh negative state in antepartum period; and Uterine size date discrepancy on their problem list.  Patient reports joint pain and pelvic pain in the setting of recent COVID + diagnosis.  Contractions: Not present. Vag. Bleeding: None.  Movement: Present. Denies any leaking of fluid.   The following portions of the patient's history were reviewed and updated as appropriate: allergies, current medications, past family history, past medical history, past social history, past surgical history and problem list.   Objective:   Vitals:   04/16/20 0958  BP: 137/84  Pulse: 99    Fetal Status:     Movement: Present     General:  Alert, oriented and cooperative. Patient is in no acute distress.  Respiratory: Normal respiratory effort, no problems with respiration noted  Mental Status: Normal mood and affect. Normal behavior. Normal judgment and thought content.  Rest  of physical exam deferred due to type of encounter  Imaging: US MFM OB FOLLOW UP  Result Date: 03/18/2020 ----------------------------------------------------------------------  OBSTETRICS REPORT                        (Signed Final 03/18/2020 04:49 pm) ---------------------------------------------------------------------- Patient Info  ID #:       [id number]                          D.O.B.:  1990-07-01 (29 yrs)  Name:       Rushie NyhanSERINA ASHLE Bralley              Visit Date: 03/18/2020 08:33 am ---------------------------------------------------------------------- Performed By  Attending:        Lin Landsmanorenthian Booker      Ref. Address:      [address] [place]                    [place]                                                              [address] [place] [address]  Performed By:     Tommie Raymond BS,       [facility], [facility]                                 [facility]  Referred By:      Marny Lowenstein                    [place] ---------------------------------------------------------------------- Orders  #  Description                           Code        Ordered By  1  [place] MFM OB FOLLOW UP                   45038.88    NICOLE NUGENT ----------------------------------------------------------------------  #  Order #                     Accession #                Episode #  1  [id number]                   [id number]                 [id number] ---------------------------------------------------------------------- Indications  [redacted] weeks gestation of pregnancy                 Z3A.31  Asthma                                          O99.89 j45.909  Genetic carrier (Silent Carrier for Alpha       Z14.8  Thalassemia)  Low Risk NIPS  Rh negative state in antepartum                 O36.0190   Size-Date Discrepancy                           O26.849  Encounter for other antenatal screening         Z36.2  follow-up ---------------------------------------------------------------------- Fetal Evaluation  Num Of Fetuses:          1  Fetal Heart Rate(bpm):   152  Cardiac Activity:        Observed  Presentation:  Cephalic  Placenta:                Anterior  P. Cord Insertion:       Previously Visualized  Amniotic Fluid  AFI FV:      Within normal limits  AFI Sum(cm)     %Tile       Largest Pocket(cm)  14.4            50          5.8  RUQ(cm)       RLQ(cm)       LUQ(cm)        LLQ(cm)  5.8           3.1           2.9            2.6 ---------------------------------------------------------------------- Biometry  BPD:      80.4  mm     G. Age:  32w 2d         70  %    CI:        78.71   %    70 - 86                                                          FL/HC:       21.1  %    19.3 - 21.3  HC:      286.6  mm     G. Age:  31w 3d         19  %    HC/AC:       0.98       0.96 - 1.17  AC:      292.5  mm     G. Age:  33w 2d         93  %    FL/BPD:      75.1  %    71 - 87  FL:       60.4  mm     G. Age:  31w 3d         39  %    FL/AC:       20.6  %    20 - 24  CER:      41.4  mm     G. Age:  33w 0d         79  %  LV:        4.3  mm  CM:        7.9  mm  Est. FW:    1976   gm     4 lb 6 oz     77  % ---------------------------------------------------------------------- OB History  Gravidity:    2         Term:   0        Prem:   0        SAB:   1  TOP:          0       Ectopic:  0        Living: 0 ---------------------------------------------------------------------- Gestational Age  LMP:           31w 2d        Date:  08/12/19  EDD:   05/18/20  U/S Today:     32w 1d                                        EDD:   05/12/20  Best:          31w 2d     Det. By:  LMP  (08/12/19)          EDD:   05/18/20 ---------------------------------------------------------------------- Anatomy  Cranium:                Previously seen        LVOT:                   Previously seen  Cavum:                 Previously seen        Aortic Arch:            Previously seen  Ventricles:            Appears normal         Ductal Arch:            Previously seen  Choroid Plexus:        Previously seen        Diaphragm:              Appears normal  Cerebellum:            Previously seen        Stomach:                Appears normal, left                                                                        sided  Posterior Fossa:       Previously seen        Abdomen:                Previously seen  Nuchal Fold:           Previously seen        Abdominal Wall:         Previously seen  Face:                  Orbits and profile     Cord Vessels:           Previously seen                         previously seen  Lips:                  Previously seen        Kidneys:                Appear normal  Palate:                Previously seen        Bladder:                Appears normal  Thoracic:  Previously seen        Spine:                  Previously seen  Heart:                 Appears normal         Upper Extremities:      Previously seen                         (4CH, axis, and                         situs)  RVOT:                  Previously seen        Lower Extremities:      Previously seen  Other:  Female gender previously seen. Technically difficult due to fetal          position. Technically difficult due to advanced gestational age. ---------------------------------------------------------------------- Cervix Uterus Adnexa  Cervix  Not visualized (advanced GA >24wks)  Uterus  No abnormality visualized.  Right Ovary  Within normal limits.  Left Ovary  Within normal limits.  Cul De Sac  No free fluid seen.  Adnexa  No abnormality visualized. ---------------------------------------------------------------------- Impression  Follow up growth to assess sized and dates descrepancy  Normal interval growth with measurements consistent  with  dates  Good fetal movement and amniotic fluid volume. ---------------------------------------------------------------------- Recommendations  Follow up growth as clinically indicated. ----------------------------------------------------------------------               Lin Landsman, MD Electronically Signed Final Report   03/18/2020 04:49 pm ----------------------------------------------------------------------   Assessment and Plan:  Pregnancy: G2P0010 at [redacted]w[redacted]d 1. Encounter for supervision of low-risk pregnancy in third trimester - No acute complaints, consider Tylenol 650 mg q 4 hours + restart maternity belt for recurrent pelvic pain --GBS and GCC swabs next visit. Preemptive teaching: indication for GBS prophylaxis --Present to [facility]U for new onset acute COVID-related symptoms  2. Uterine size-date discrepancy in third trimester - EFW 77%/1976 g as of 03/18/2020  3. Rh negative state in antepartum period - S/p Rhogam 03/14/2020  4. Alpha thalassemia silent carrier   5. HSV (herpes simplex virus) - Initiate prophylaxis, previously prescribed   Preterm labor symptoms and general obstetric precautions including but not limited to vaginal bleeding, contractions, leaking of fluid and fetal movement were reviewed in detail with the patient. I discussed the assessment and treatment plan with the patient. The patient was provided an opportunity to ask questions and all were answered. The patient agreed with the plan and demonstrated an understanding of the instructions. The patient was advised to call back or seek an in-person office evaluation/go to [facility]U at [facility] for any urgent or concerning symptoms. Please refer to After Visit Summary for other counseling recommendations.   I provided ten minutes of face-to-face time during this encounter.  Return in about 1 week (around 04/23/2020) for 36 week swabs.  No future appointments.  Calvert Cantor,  CNM [facility] [employer], [facility]

## 2020-04-23 ENCOUNTER — Other Ambulatory Visit: Payer: Self-pay

## 2020-04-23 ENCOUNTER — Ambulatory Visit (INDEPENDENT_AMBULATORY_CARE_PROVIDER_SITE_OTHER): Payer: 59 | Admitting: Nurse Practitioner

## 2020-04-23 ENCOUNTER — Other Ambulatory Visit: Payer: Self-pay | Admitting: Nurse Practitioner

## 2020-04-23 ENCOUNTER — Other Ambulatory Visit (HOSPITAL_COMMUNITY)
Admission: RE | Admit: 2020-04-23 | Discharge: 2020-04-23 | Disposition: A | Discharge status: Home / Self Care | Payer: 59 | Source: Ambulatory Visit | Attending: Nurse Practitioner | Admitting: Nurse Practitioner

## 2020-04-23 VITALS — BP 124/77 | HR 102 | Wt 214.0 lb

## 2020-04-23 DIAGNOSIS — B009 Herpesviral infection, unspecified: Secondary | ICD-10-CM

## 2020-04-23 DIAGNOSIS — Z3493 Encounter for supervision of normal pregnancy, unspecified, third trimester: Secondary | ICD-10-CM | POA: Diagnosis not present

## 2020-04-23 DIAGNOSIS — Z3A36 36 weeks gestation of pregnancy: Secondary | ICD-10-CM

## 2020-04-23 NOTE — Progress Notes (Signed)
    Subjective:  Amanda Kerr is a 30 y.o. G2P0010 at [redacted]w[redacted]d being seen today for ongoing prenatal care.  She is currently monitored for the following issues for this low-risk pregnancy and has Anxiety and depression; History of asthma; Allodynia; HSV (herpes simplex virus); Supervision of low-risk pregnancy; Female pelvic inflammatory disease; Adjustment disorder; Alpha thalassemia silent carrier; Rh negative state in antepartum period; and Uterine size date discrepancy on their problem list.  Patient reports no complaints.  Contractions: Not present. Vag. Bleeding: None.  Movement: Present. Denies leaking of fluid.   The following portions of the patient's history were reviewed and updated as appropriate: allergies, current medications, past family history, past medical history, past social history, past surgical history and problem list. Problem list updated.  Objective:   Vitals:   04/23/20 0903  BP: 124/77  Pulse: (!) 102  Weight: 214 lb (97.1 kg)    Fetal Status: Fetal Heart Rate (bpm): 143 Fundal Height: 38 cm Movement: Present  Presentation: Vertex  General:  Alert, oriented and cooperative. Patient is in no acute distress.  Skin: Skin is warm and dry. No rash noted.   Cardiovascular: Normal heart rate noted  Respiratory: Normal respiratory effort, no problems with respiration noted  Abdomen: Soft, gravid, appropriate for gestational age. Pain/Pressure: Present     Pelvic:  Cervical exam performed Dilation: 1 Effacement (%): 100 Station: -1  Vaginal swabs done  Extremities: Normal range of motion.  Edema: None  Mental Status: Normal mood and affect. Normal behavior. Normal judgment and thought content.   Urinalysis:      Assessment and Plan:  Pregnancy: G2P0010 at [redacted]w[redacted]d  1. Encounter for supervision of low-risk pregnancy in third trimester Reviewed contractions with client Wants Mirena IUD at [facility] visit  - GC/Chlamydia probe amp ([place])not at [facility] - Culture,  beta strep (group b only)  2. HSV (herpes simplex virus) Taking suppression BID   Term labor symptoms and general obstetric precautions including but not limited to vaginal bleeding, contractions, leaking of fluid and fetal movement were reviewed in detail with the patient. Please refer to After Visit Summary for other counseling recommendations.  Return in about 1 week (around 04/30/2020).  Nolene Bernheim, RN, MSN, NP-BC Nurse Practitioner, [facility] for [employer], [facility] 04/23/2020 3:28 PM

## 2020-04-23 NOTE — Patient Instructions (Addendum)
[employer]'s Emergency Medicine: Concepts and Clinical Practice (9th ed., pp. 2296- 2312). Elsevier.">  Braxton Hicks Contractions Contractions of the uterus can occur throughout pregnancy, but they are not always a sign that you are in labor. You may have practice contractions called Braxton Hicks contractions. These false labor contractions are sometimes confused with true labor. What are Braxton Hicks contractions? Braxton Hicks contractions are tightening movements that occur in the muscles of the uterus before labor. Unlike true labor contractions, these contractions do not result in opening (dilation) and thinning of the cervix. Toward the end of pregnancy (32-34 weeks), Braxton Hicks contractions can happen more often and may become stronger. These contractions are sometimes difficult to tell apart from true labor because they can be very uncomfortable. You should not feel embarrassed if you go to the hospital with false labor. Sometimes, the only way to tell if you are in true labor is for your health care provider to look for changes in the cervix. The health care provider will do a physical exam and may monitor your contractions. If you are not in true labor, the exam should show that your cervix is not dilating and your water has not broken. If there are no other health problems associated with your pregnancy, it is completely safe for you to be sent home with false labor. You may continue to have Braxton Hicks contractions until you go into true labor. How to tell the difference between true labor and false labor True labor  Contractions last 30-70 seconds.  Contractions become very regular.  Discomfort is usually felt in the top of the uterus, and it spreads to the lower abdomen and low back.  Contractions do not go away with walking.  Contractions usually become more intense and increase in frequency.  The cervix dilates and gets thinner. False labor  Contractions are usually shorter  and not as strong as true labor contractions.  Contractions are usually irregular.  Contractions are often felt in the front of the lower abdomen and in the groin.  Contractions may go away when you walk around or change positions while lying down.  Contractions get weaker and are shorter-lasting as time goes on.  The cervix usually does not dilate or become thin. Follow these instructions at home:  Take over-the-counter and prescription medicines only as told by your health care provider.  Keep up with your usual exercises and follow other instructions from your health care provider.  Eat and drink lightly if you think you are going into labor.  If Braxton Hicks contractions are making you uncomfortable: ? Change your position from lying down or resting to walking, or change from walking to resting. ? Sit and rest in a tub of warm water. ? Drink enough fluid to keep your urine pale yellow. Dehydration may cause these contractions. ? Do slow and deep breathing several times an hour.  Keep all follow-up prenatal visits as told by your health care provider. This is important.   Contact a health care provider if:  You have a fever.  You have continuous pain in your abdomen. Get help right away if:  Your contractions become stronger, more regular, and closer together.  You have fluid leaking or gushing from your vagina.  You pass blood-tinged mucus (bloody show).  You have bleeding from your vagina.  You have low back pain that you never had before.  You feel your baby's head pushing down and causing pelvic pressure.  Your baby is not moving inside   you as much as it used to. Summary  Contractions that occur before labor are called Braxton Hicks contractions, false labor, or practice contractions.  Braxton Hicks contractions are usually shorter, weaker, farther apart, and less regular than true labor contractions. True labor contractions usually become progressively  stronger and regular, and they become more frequent.  Manage discomfort from [facility] contractions by changing position, resting in a warm bath, drinking plenty of water, or practicing deep breathing. This information is not intended to replace advice given to you by your health care provider. Make sure you discuss any questions you have with your health care provider. Document Revised: 02/26/2017 Document Reviewed: 07/30/2016 Elsevier Patient Education  2021 Elsevier Inc.  Signs and Symptoms of Labor Labor is the body's natural process of moving the baby and the placenta out of the uterus. The process of labor usually starts when the baby is full-term, between 4 and 40 weeks of pregnancy. Signs and symptoms that you are close to going into labor As your body prepares for labor and the birth of your baby, you may notice the following symptoms in the weeks and days before true labor starts:  Passing a small amount of thick, bloody mucus from your vagina. This is called normal bloody show or losing your mucus plug. This may happen more than a week before labor begins, or right before labor begins, as the opening of the cervix starts to widen (dilate). For some women, the entire mucus plug passes at once. For others, pieces of the mucus plug may gradually pass over several days.  Your baby moving (dropping) lower in your pelvis to get into position for birth (lightening). When this happens, you may feel more pressure on your bladder and pelvic bone and less pressure on your ribs. This may make it easier to breathe. It may also cause you to need to urinate more often and have problems with bowel movements.  Having "practice contractions," also called Braxton Hicks contractions or false labor. These occur at irregular (unevenly spaced) intervals that are more than 10 minutes apart. False labor contractions are common after exercise or sexual activity. They will stop if you change position, rest, or  drink fluids. These contractions are usually mild and do not get stronger over time. They may feel like: ? A backache or back pain. ? Mild cramps, similar to menstrual cramps. ? Tightening or pressure in your abdomen. Other early symptoms include:  Nausea or loss of appetite.  Diarrhea.  Having a sudden burst of energy, or feeling very tired.  Mood changes.  Having trouble sleeping.   Signs and symptoms that labor has begun Signs that you are in labor may include:  Having contractions that come at regular (evenly spaced) intervals and increase in intensity. This may feel like more intense tightening or pressure in your abdomen that moves to your back. ? Contractions may also feel like rhythmic pain in your upper thighs or back that comes and goes at regular intervals. ? For first-time mothers, this change in intensity of contractions often occurs at a more gradual pace. ? Women who have given birth before may notice a more rapid progression of contraction changes.  Feeling pressure in the vaginal area.  Your water breaking (rupture of membranes). This is when the sac of fluid that surrounds your baby breaks. Fluid leaking from your vagina may be clear or blood-tinged. Labor usually starts within 24 hours of your water breaking, but it may take longer to begin. ?  Some women may feel a sudden gush of fluid. ? Others notice that their underwear repeatedly becomes damp. Follow these instructions at home:  When labor starts, or if your water breaks, call your health care provider or nurse care line. Based on your situation, they will determine when you should go in for an exam.  During early labor, you may be able to rest and manage symptoms at home. Some strategies to try at home include: ? Breathing and relaxation techniques. ? Taking a warm bath or shower. ? Listening to music. ? Using a heating pad on the lower back for pain. If you are directed to use heat:  Place a towel  between your skin and the heat source.  Leave the heat on for 20-30 minutes.  Remove the heat if your skin turns bright red. This is especially important if you are unable to feel pain, heat, or cold. You may have a greater risk of getting burned.   Contact a health care provider if:  Your labor has started.  Your water breaks. Get help right away if:  You have painful, regular contractions that are 5 minutes apart or less.  Labor starts before you are [redacted] weeks along in your pregnancy.  You have a fever.  You have bright red blood coming from your vagina.  You do not feel your baby moving.  You have a severe headache with or without vision problems.  You have severe nausea, vomiting, or diarrhea.  You have chest pain or shortness of breath. These symptoms may represent a serious problem that is an emergency. Do not wait to see if the symptoms will go away. Get medical help right away. Call your local emergency services (911 in the [place] Do not drive yourself to the hospital. Summary  Labor is your body's natural process of moving your baby and the placenta out of your uterus.  The process of labor usually starts when your baby is full-term, between 26 and 40 weeks of pregnancy.  When labor starts, or if your water breaks, call your health care provider or nurse care line. Based on your situation, they will determine when you should go in for an exam. This information is not intended to replace advice given to you by your health care provider. Make sure you discuss any questions you have with your health care provider. Document Revised: 01/06/2020 Document Reviewed: 01/06/2020 Elsevier Patient Education  2021 Elsevier Inc.   Childbirth Education Options: [facility] Department Classes:  Childbirth education classes can help you get ready for a positive parenting experience. You can also meet other expectant parents and get free stuff for your baby. Each class  runs for five weeks on the same night and costs $45 for the mother-to-be and her support person. Medicaid covers the cost if you are eligible. Call [phone] to register. [facility] Childbirth Education:  [phone] or [phone] or [email or web address]  Baby & Me Class: Discuss newborn & infant parenting and family adjustment issues with other new mothers in a relaxed environment. Each week brings a new speaker or baby-centered activity. We encourage new mothers to join [place] every Thursday at 11:00am. Babies birth until crawling. No registration or fee. Daddy [employer]: This course offers Dads-to-be the tools and knowledge needed to feel confident on their journey to becoming new fathers. Experienced dads, who have been trained as coaches, teach dads-to-be how to hold, comfort, diaper, swaddle and play with their infant while being able to support the new  mom as well. A class for men taught by men. $25/dad Big Brother/Big Sister: Let your children share in the joy of a new brother or sister in this special class designed just for them. Class includes discussion about how families care for babies: swaddling, holding, diapering, safety as well as how they can be helpful in their new role. This class is designed for children ages 2 to 506, but any age is welcome. Please register each child individually. $5/child  Mom Talk: This mom-led group offers support and connection to mothers as they journey through the adjustments and struggles of that sometimes overwhelming first year after the birth of a child. Tuesdays at 10:00am and Thursdays at 6:00pm. Babies welcome. No registration or fee. Breastfeeding Support Group: This group is a mother-to-mother support circle where moms have the opportunity to share their breastfeeding experiences. A Lactation Consultant is present for questions and concerns. Meets each Tuesday at 11:00am. No fee or registration. Breastfeeding Your Baby: Learn what to expect  in the first days of breastfeeding your newborn.  This class will help you feel more confident with the skills needed to begin your breastfeeding experience. Many new mothers are concerned about breastfeeding after leaving the hospital. This class will also address the most common fears and challenges about breastfeeding during the first few weeks, months and beyond. (call for fee) Comfort Techniques and Tour: This 2 hour interactive class will provide you the opportunity to learn & practice hands-on techniques that can help relieve some of the discomfort of labor and encourage your baby to rotate toward the best position for birth. You and your partner will be able to try a variety of labor positions with birth balls and rebozos as well as practice breathing, relaxation, and visualization techniques. A [facility] is included with this class. $20 per registrant and support person Childbirth Class- Weekend Option: This class is a Weekend version of our Birth & Baby series. It is designed for parents who have a difficult time fitting several weeks of classes into their schedule. It covers the care of your newborn and the basics of labor and childbirth. It also includes a Maternity Care Center Tour of [facility] and lunch. The class is held two consecutive days: beginning on Friday evening from 6:30 - 8:30 p.m. and the next day, Saturday from 9 a.m. - 4 p.m. (call for fee) [facility] DolinWaterbirth Class: Interested in a waterbirth?  This informational class will help you discover whether waterbirth is the right fit for you. Education about waterbirth itself, supplies you would need and how to assemble your support team is what you can expect from this class. Some obstetrical practices require this class in order to pursue a waterbirth. (Not all obstetrical practices offer waterbirth-check with your healthcare provider.) Register only the expectant mom, but you are encouraged to bring  your partner to class! Required if planning waterbirth, no fee. Infant/Child CPR: Parents, grandparents, babysitters, and friends learn Cardio-Pulmonary Resuscitation skills for infants and children. You will also learn how to treat both conscious and unconscious choking in infants and children. This Family & Friends program does not offer certification. Register each participant individually to ensure that enough mannequins are available. (Call for fee) Grandparent Love: Expecting a grandbaby? This class is for you! Learn about the latest infant care and safety recommendations and ways to support your own child as he or she transitions into the parenting role. Taught by Registered Nurses who are childbirth instructors,  but most importantly...they are grandmothers too! $10/person. Childbirth Class- Natural Childbirth: This series of 5 weekly classes is for expectant parents who want to learn and practice natural methods of coping with the process of labor and childbirth. Relaxation, breathing, massage, visualization, role of the partner, and helpful positioning are highlighted. Participants learn how to be confident in their body's ability to give birth. This class will empower and help parents make informed decisions about their own care. Includes discussion that will help new parents transition into the immediate postpartum period. Maternity Care Center Tour of [facility] is included. We suggest taking this class between 25-32 weeks, but it's only a recommendation. $75 per registrant and one support person or $30 Medicaid. Childbirth Class- 3 week Series: This option of 3 weekly classes helps you and your labor partner prepare for childbirth. Newborn care, labor & birth, cesarean birth, pain management, and comfort techniques are discussed and a Maternity Care Center Tour of [facility] is included. The class meets at the same time, on the same day of the week for 3 consecutive weeks beginning with  the starting date you choose. $60 for registrant and one support person.  Marvelous Multiples: Expecting twins, triplets, or more? This class covers the differences in labor, birth, parenting, and breastfeeding issues that face multiples' parents. NICU tour is included. Led by a Certified Childbirth Educator who is the mother of twins. No fee. Caring for Baby: This class is for expectant and adoptive parents who want to learn and practice the most up-to-date newborn care for their babies. Focus is on birth through the first six weeks of life. Topics include feeding, bathing, diapering, crying, umbilical cord care, circumcision care and safe sleep. Parents learn to recognize symptoms of illness and when to call the pediatrician. Register only the mom-to-be and your partner or support person can plan to come with you! $10 per registrant and support person Childbirth Class- online option: This online class offers you the freedom to complete a Birth and Baby series in the comfort of your own home. The flexibility of this option allows you to review sections at your own pace, at times convenient to you and your support people. It includes additional video information, animations, quizzes, and extended activities. Get organized with helpful eClass tools, checklists, and trackers. Once you register online for the class, you will receive an email within a few days to accept the invitation and begin the class when the time is right for you. The content will be available to you for 60 days. $60 for 60 days of online access for you and your support people.

## 2020-04-24 LAB — GC/CHLAMYDIA PROBE AMP (~~LOC~~) NOT AT ARMC
Chlamydia: NEGATIVE
Comment: NEGATIVE
Comment: NORMAL
Neisseria Gonorrhea: NEGATIVE

## 2020-04-26 ENCOUNTER — Encounter: Payer: Self-pay | Admitting: Nurse Practitioner

## 2020-04-26 DIAGNOSIS — O99820 Streptococcus B carrier state complicating pregnancy: Secondary | ICD-10-CM | POA: Insufficient documentation

## 2020-04-26 LAB — CULTURE, BETA STREP (GROUP B ONLY): Strep Gp B Culture: POSITIVE — AB

## 2020-05-01 ENCOUNTER — Telehealth (INDEPENDENT_AMBULATORY_CARE_PROVIDER_SITE_OTHER): Payer: 59 | Admitting: Family Medicine

## 2020-05-01 ENCOUNTER — Encounter: Payer: Self-pay | Admitting: Family Medicine

## 2020-05-01 VITALS — BP 126/78 | HR 102

## 2020-05-01 DIAGNOSIS — O99343 Other mental disorders complicating pregnancy, third trimester: Secondary | ICD-10-CM

## 2020-05-01 DIAGNOSIS — Z3A37 37 weeks gestation of pregnancy: Secondary | ICD-10-CM

## 2020-05-01 DIAGNOSIS — F419 Anxiety disorder, unspecified: Secondary | ICD-10-CM

## 2020-05-01 DIAGNOSIS — F32A Depression, unspecified: Secondary | ICD-10-CM

## 2020-05-01 DIAGNOSIS — O99013 Anemia complicating pregnancy, third trimester: Secondary | ICD-10-CM

## 2020-05-01 DIAGNOSIS — B009 Herpesviral infection, unspecified: Secondary | ICD-10-CM

## 2020-05-01 DIAGNOSIS — O98513 Other viral diseases complicating pregnancy, third trimester: Secondary | ICD-10-CM

## 2020-05-01 DIAGNOSIS — D563 Thalassemia minor: Secondary | ICD-10-CM

## 2020-05-01 DIAGNOSIS — O26899 Other specified pregnancy related conditions, unspecified trimester: Secondary | ICD-10-CM

## 2020-05-01 DIAGNOSIS — Z6791 Unspecified blood type, Rh negative: Secondary | ICD-10-CM

## 2020-05-01 DIAGNOSIS — O360130 Maternal care for anti-D [Rh] antibodies, third trimester, not applicable or unspecified: Secondary | ICD-10-CM

## 2020-05-01 DIAGNOSIS — O99820 Streptococcus B carrier state complicating pregnancy: Secondary | ICD-10-CM

## 2020-05-01 DIAGNOSIS — Z3493 Encounter for supervision of normal pregnancy, unspecified, third trimester: Secondary | ICD-10-CM

## 2020-05-01 DIAGNOSIS — O26843 Uterine size-date discrepancy, third trimester: Secondary | ICD-10-CM

## 2020-05-01 NOTE — Patient Instructions (Signed)
 Contraception Choices Contraception, also called birth control, refers to methods or devices that prevent pregnancy. Hormonal methods Contraceptive implant A contraceptive implant is a thin, plastic tube that contains a hormone that prevents pregnancy. It is different from an intrauterine device (IUD). It is inserted into the upper part of the arm by a health care provider. Implants can be effective for up to 3 years. Progestin-only injections Progestin-only injections are injections of progestin, a synthetic form of the hormone progesterone. They are given every 3 months by a health care provider. Birth control pills Birth control pills are pills that contain hormones that prevent pregnancy. They must be taken once a day, preferably at the same time each day. A prescription is needed to use this method of contraception. Birth control patch The birth control patch contains hormones that prevent pregnancy. It is placed on the skin and must be changed once a week for three weeks and removed on the fourth week. A prescription is needed to use this method of contraception. Vaginal ring A vaginal ring contains hormones that prevent pregnancy. It is placed in the vagina for three weeks and removed on the fourth week. After that, the process is repeated with a new ring. A prescription is needed to use this method of contraception. Emergency contraceptive Emergency contraceptives prevent pregnancy after unprotected sex. They come in pill form and can be taken up to 5 days after sex. They work best the sooner they are taken after having sex. Most emergency contraceptives are available without a prescription. This method should not be used as your only form of birth control.   Barrier methods Female condom A female condom is a thin sheath that is worn over the penis during sex. Condoms keep sperm from going inside a woman's body. They can be used with a sperm-killing substance (spermicide) to increase their  effectiveness. They should be thrown away after one use. Female condom A female condom is a soft, loose-fitting sheath that is put into the vagina before sex. The condom keeps sperm from going inside a woman's body. They should be thrown away after one use. Diaphragm A diaphragm is a soft, dome-shaped barrier. It is inserted into the vagina before sex, along with a spermicide. The diaphragm blocks sperm from entering the uterus, and the spermicide kills sperm. A diaphragm should be left in the vagina for 6-8 hours after sex and removed within 24 hours. A diaphragm is prescribed and fitted by a health care provider. A diaphragm should be replaced every 1-2 years, after giving birth, after gaining more than 15 lb (6.8 kg), and after pelvic surgery. Cervical cap A cervical cap is a round, soft latex or plastic cup that fits over the cervix. It is inserted into the vagina before sex, along with spermicide. It blocks sperm from entering the uterus. The cap should be left in place for 6-8 hours after sex and removed within 48 hours. A cervical cap must be prescribed and fitted by a health care provider. It should be replaced every 2 years. Sponge A sponge is a soft, circular piece of polyurethane foam with spermicide in it. The sponge helps block sperm from entering the uterus, and the spermicide kills sperm. To use it, you make it wet and then insert it into the vagina. It should be inserted before sex, left in for at least 6 hours after sex, and removed and thrown away within 30 hours. Spermicides Spermicides are chemicals that kill or block sperm from entering the   cervix and uterus. They can come as a cream, jelly, suppository, foam, or tablet. A spermicide should be inserted into the vagina with an applicator at least 10-15 minutes before sex to allow time for it to work. The process must be repeated every time you have sex. Spermicides do not require a prescription.   Intrauterine  contraception Intrauterine device (IUD) An IUD is a T-shaped device that is put in a woman's uterus. There are two types:  Hormone IUD.This type contains progestin, a synthetic form of the hormone progesterone. This type can stay in place for 3-5 years.  Copper IUD.This type is wrapped in copper wire. It can stay in place for 10 years. Permanent methods of contraception Female tubal ligation In this method, a woman's fallopian tubes are sealed, tied, or blocked during surgery to prevent eggs from traveling to the uterus. Hysteroscopic sterilization In this method, a small, flexible insert is placed into each fallopian tube. The inserts cause scar tissue to form in the fallopian tubes and block them, so sperm cannot reach an egg. The procedure takes about 3 months to be effective. Another form of birth control must be used during those 3 months. Female sterilization This is a procedure to tie off the tubes that carry sperm (vasectomy). After the procedure, the man can still ejaculate fluid (semen). Another form of birth control must be used for 3 months after the procedure. Natural planning methods Natural family planning In this method, a couple does not have sex on days when the woman could become pregnant. Calendar method In this method, the woman keeps track of the length of each menstrual cycle, identifies the days when pregnancy can happen, and does not have sex on those days. Ovulation method In this method, a couple avoids sex during ovulation. Symptothermal method This method involves not having sex during ovulation. The woman typically checks for ovulation by watching changes in her temperature and in the consistency of cervical mucus. Post-ovulation method In this method, a couple waits to have sex until after ovulation. Where to find more information  Centers for Disease Control and Prevention: www.cdc.gov Summary  Contraception, also called birth control, refers to methods or  devices that prevent pregnancy.  Hormonal methods of contraception include implants, injections, pills, patches, vaginal rings, and emergency contraceptives.  Barrier methods of contraception can include female condoms, female condoms, diaphragms, cervical caps, sponges, and spermicides.  There are two types of IUDs (intrauterine devices). An IUD can be put in a woman's uterus to prevent pregnancy for 3-5 years.  Permanent sterilization can be done through a procedure for males and females. Natural family planning methods involve nothaving sex on days when the woman could become pregnant. This information is not intended to replace advice given to you by your health care provider. Make sure you discuss any questions you have with your health care provider. Document Revised: 08/21/2019 Document Reviewed: 08/21/2019 Elsevier Patient Education  2021 Elsevier Inc.   Breastfeeding  Choosing to breastfeed is one of the best decisions you can make for yourself and your baby. A change in hormones during pregnancy causes your breasts to make breast milk in your milk-producing glands. Hormones prevent breast milk from being released before your baby is born. They also prompt milk flow after birth. Once breastfeeding has begun, thoughts of your baby, as well as his or her sucking or crying, can stimulate the release of milk from your milk-producing glands. Benefits of breastfeeding Research shows that breastfeeding offers many health benefits   for infants and mothers. It also offers a cost-free and convenient way to feed your baby. For your baby  Your first milk (colostrum) helps your baby's digestive system to function better.  Special cells in your milk (antibodies) help your baby to fight off infections.  Breastfed babies are less likely to develop asthma, allergies, obesity, or type 2 diabetes. They are also at lower risk for sudden infant death syndrome (SIDS).  Nutrients in breast milk are better  able to meet your baby's needs compared to infant formula.  Breast milk improves your baby's brain development. For you  Breastfeeding helps to create a very special bond between you and your baby.  Breastfeeding is convenient. Breast milk costs nothing and is always available at the correct temperature.  Breastfeeding helps to burn calories. It helps you to lose the weight that you gained during pregnancy.  Breastfeeding makes your uterus return faster to its size before pregnancy. It also slows bleeding (lochia) after you give birth.  Breastfeeding helps to lower your risk of developing type 2 diabetes, osteoporosis, rheumatoid arthritis, cardiovascular disease, and breast, ovarian, uterine, and endometrial cancer later in life. Breastfeeding basics Starting breastfeeding  Find a comfortable place to sit or lie down, with your neck and back well-supported.  Place a pillow or a rolled-up blanket under your baby to bring him or her to the level of your breast (if you are seated). Nursing pillows are specially designed to help support your arms and your baby while you breastfeed.  Make sure that your baby's tummy (abdomen) is facing your abdomen.  Gently massage your breast. With your fingertips, massage from the outer edges of your breast inward toward the nipple. This encourages milk flow. If your milk flows slowly, you may need to continue this action during the feeding.  Support your breast with 4 fingers underneath and your thumb above your nipple (make the letter "C" with your hand). Make sure your fingers are well away from your nipple and your baby's mouth.  Stroke your baby's lips gently with your finger or nipple.  When your baby's mouth is open wide enough, quickly bring your baby to your breast, placing your entire nipple and as much of the areola as possible into your baby's mouth. The areola is the colored area around your nipple. ? More areola should be visible above your  baby's upper lip than below the lower lip. ? Your baby's lips should be opened and extended outward (flanged) to ensure an adequate, comfortable latch. ? Your baby's tongue should be between his or her lower gum and your breast.  Make sure that your baby's mouth is correctly positioned around your nipple (latched). Your baby's lips should create a seal on your breast and be turned out (everted).  It is common for your baby to suck about 2-3 minutes in order to start the flow of breast milk. Latching Teaching your baby how to latch onto your breast properly is very important. An improper latch can cause nipple pain, decreased milk supply, and poor weight gain in your baby. Also, if your baby is not latched onto your nipple properly, he or she may swallow some air during feeding. This can make your baby fussy. Burping your baby when you switch breasts during the feeding can help to get rid of the air. However, teaching your baby to latch on properly is still the best way to prevent fussiness from swallowing air while breastfeeding. Signs that your baby has successfully latched onto   your nipple  Silent tugging or silent sucking, without causing you pain. Infant's lips should be extended outward (flanged).  Swallowing heard between every 3-4 sucks once your milk has started to flow (after your let-down milk reflex occurs).  Muscle movement above and in front of his or her ears while sucking. Signs that your baby has not successfully latched onto your nipple  Sucking sounds or smacking sounds from your baby while breastfeeding.  Nipple pain. If you think your baby has not latched on correctly, slip your finger into the corner of your baby's mouth to break the suction and place it between your baby's gums. Attempt to start breastfeeding again. Signs of successful breastfeeding Signs from your baby  Your baby will gradually decrease the number of sucks or will completely stop sucking.  Your baby  will fall asleep.  Your baby's body will relax.  Your baby will retain a small amount of milk in his or her mouth.  Your baby will let go of your breast by himself or herself. Signs from you  Breasts that have increased in firmness, weight, and size 1-3 hours after feeding.  Breasts that are softer immediately after breastfeeding.  Increased milk volume, as well as a change in milk consistency and color by the fifth day of breastfeeding.  Nipples that are not sore, cracked, or bleeding. Signs that your baby is getting enough milk  Wetting at least 1-2 diapers during the first 24 hours after birth.  Wetting at least 5-6 diapers every 24 hours for the first week after birth. The urine should be clear or pale yellow by the age of 5 days.  Wetting 6-8 diapers every 24 hours as your baby continues to grow and develop.  At least 3 stools in a 24-hour period by the age of 5 days. The stool should be soft and yellow.  At least 3 stools in a 24-hour period by the age of 7 days. The stool should be seedy and yellow.  No loss of weight greater than 10% of birth weight during the first 3 days of life.  Average weight gain of 4-7 oz (113-198 g) per week after the age of 4 days.  Consistent daily weight gain by the age of 5 days, without weight loss after the age of 2 weeks. After a feeding, your baby may spit up a small amount of milk. This is normal. Breastfeeding frequency and duration Frequent feeding will help you make more milk and can prevent sore nipples and extremely full breasts (breast engorgement). Breastfeed when you feel the need to reduce the fullness of your breasts or when your baby shows signs of hunger. This is called "breastfeeding on demand." Signs that your baby is hungry include:  Increased alertness, activity, or restlessness.  Movement of the head from side to side.  Opening of the mouth when the corner of the mouth or cheek is stroked (rooting).  Increased  sucking sounds, smacking lips, cooing, sighing, or squeaking.  Hand-to-mouth movements and sucking on fingers or hands.  Fussing or crying. Avoid introducing a pacifier to your baby in the first 4-6 weeks after your baby is born. After this time, you may choose to use a pacifier. Research has shown that pacifier use during the first year of a baby's life decreases the risk of sudden infant death syndrome (SIDS). Allow your baby to feed on each breast as long as he or she wants. When your baby unlatches or falls asleep while feeding from the   first breast, offer the second breast. Because newborns are often sleepy in the first few weeks of life, you may need to awaken your baby to get him or her to feed. Breastfeeding times will vary from baby to baby. However, the following rules can serve as a guide to help you make sure that your baby is properly fed:  Newborns (babies 4 weeks of age or younger) may breastfeed every 1-3 hours.  Newborns should not go without breastfeeding for longer than 3 hours during the day or 5 hours during the night.  You should breastfeed your baby a minimum of 8 times in a 24-hour period. Breast milk pumping Pumping and storing breast milk allows you to make sure that your baby is exclusively fed your breast milk, even at times when you are unable to breastfeed. This is especially important if you go back to work while you are still breastfeeding, or if you are not able to be present during feedings. Your lactation consultant can help you find a method of pumping that works best for you and give you guidelines about how long it is safe to store breast milk.      Caring for your breasts while you breastfeed Nipples can become dry, cracked, and sore while breastfeeding. The following recommendations can help keep your breasts moisturized and healthy:  Avoid using soap on your nipples.  Wear a supportive bra designed especially for nursing. Avoid wearing underwire-style  bras or extremely tight bras (sports bras).  Air-dry your nipples for 3-4 minutes after each feeding.  Use only cotton bra pads to absorb leaked breast milk. Leaking of breast milk between feedings is normal.  Use lanolin on your nipples after breastfeeding. Lanolin helps to maintain your skin's normal moisture barrier. Pure lanolin is not harmful (not toxic) to your baby. You may also hand express a few drops of breast milk and gently massage that milk into your nipples and allow the milk to air-dry. In the first few weeks after giving birth, some women experience breast engorgement. Engorgement can make your breasts feel heavy, warm, and tender to the touch. Engorgement peaks within 3-5 days after you give birth. The following recommendations can help to ease engorgement:  Completely empty your breasts while breastfeeding or pumping. You may want to start by applying warm, moist heat (in the shower or with warm, water-soaked hand towels) just before feeding or pumping. This increases circulation and helps the milk flow. If your baby does not completely empty your breasts while breastfeeding, pump any extra milk after he or she is finished.  Apply ice packs to your breasts immediately after breastfeeding or pumping, unless this is too uncomfortable for you. To do this: ? Put ice in a plastic bag. ? Place a towel between your skin and the bag. ? Leave the ice on for 20 minutes, 2-3 times a day.  Make sure that your baby is latched on and positioned properly while breastfeeding. If engorgement persists after 48 hours of following these recommendations, contact your health care provider or a lactation consultant. Overall health care recommendations while breastfeeding  Eat 3 healthy meals and 3 snacks every day. Well-nourished mothers who are breastfeeding need an additional 450-500 calories a day. You can meet this requirement by increasing the amount of a balanced diet that you eat.  Drink  enough water to keep your urine pale yellow or clear.  Rest often, relax, and continue to take your prenatal vitamins to prevent fatigue, stress, and low   vitamin and mineral levels in your body (nutrient deficiencies).  Do not use any products that contain nicotine or tobacco, such as cigarettes and e-cigarettes. Your baby may be harmed by chemicals from cigarettes that pass into breast milk and exposure to secondhand smoke. If you need help quitting, ask your health care provider.  Avoid alcohol.  Do not use illegal drugs or marijuana.  Talk with your health care provider before taking any medicines. These include over-the-counter and prescription medicines as well as vitamins and herbal supplements. Some medicines that may be harmful to your baby can pass through breast milk.  It is possible to become pregnant while breastfeeding. If birth control is desired, ask your health care provider about options that will be safe while breastfeeding your baby. Where to find more information: [employer]: www.llli.org Contact a health care provider if:  You feel like you want to stop breastfeeding or have become frustrated with breastfeeding.  Your nipples are cracked or bleeding.  Your breasts are red, tender, or warm.  You have: ? Painful breasts or nipples. ? A swollen area on either breast. ? A fever or chills. ? Nausea or vomiting. ? Drainage other than breast milk from your nipples.  Your breasts do not become full before feedings by the fifth day after you give birth.  You feel sad and depressed.  Your baby is: ? Too sleepy to eat well. ? Having trouble sleeping. ? More than 1 week old and wetting fewer than 6 diapers in a 24-hour period. ? Not gaining weight by 5 days of age.  Your baby has fewer than 3 stools in a 24-hour period.  Your baby's skin or the white parts of his or her eyes become yellow. Get help right away if:  Your baby is overly tired  (lethargic) and does not want to wake up and feed.  Your baby develops an unexplained fever. Summary  Breastfeeding offers many health benefits for infant and mothers.  Try to breastfeed your infant when he or she shows early signs of hunger.  Gently tickle or stroke your baby's lips with your finger or nipple to allow the baby to open his or her mouth. Bring the baby to your breast. Make sure that much of the areola is in your baby's mouth. Offer one side and burp the baby before you offer the other side.  Talk with your health care provider or lactation consultant if you have questions or you face problems as you breastfeed. This information is not intended to replace advice given to you by your health care provider. Make sure you discuss any questions you have with your health care provider. Document Revised: 06/10/2017 Document Reviewed: 04/17/2016 Elsevier Patient Education  2021 Elsevier Inc.  

## 2020-05-01 NOTE — Progress Notes (Signed)
I connected with Amanda Kerr 05/01/20 at  9:35 AM EST by: MyChart video and verified that I am speaking with the correct person using two identifiers.  Patient is located at home and provider is located at [facility].     The purpose of this virtual visit is to provide medical care while limiting exposure to the novel coronavirus. I discussed the limitations, risks, security and privacy concerns of performing an evaluation and management service by MyChart video and the availability of in person appointments. I also discussed with the patient that there may be a patient responsible charge related to this service. By engaging in this virtual visit, you consent to the provision of healthcare.  Additionally, you authorize for your insurance to be billed for the services provided during this visit.  The patient expressed understanding and agreed to proceed.  The following staff members participated in the virtual visit:  Venora Maples, MD/MPH    PRENATAL VISIT NOTE  Subjective:  Amanda Kerr is a 30 y.o. G2P0010 at [redacted]w[redacted]d  for phone visit for ongoing prenatal care.  She is currently monitored for the following issues for this low-risk pregnancy and has Anxiety and depression; History of asthma; Allodynia; HSV (herpes simplex virus); Supervision of low-risk pregnancy; Female pelvic inflammatory disease; Adjustment disorder; Alpha thalassemia silent carrier; Rh negative state in antepartum period; Uterine size date discrepancy; and Group B Streptococcus carrier, +RV culture, currently pregnant on their problem list.  Patient reports fatigue, no contractions and BH contractions.  Contractions: Irritability. Vag. Bleeding: None.  Movement: Present. Denies leaking of fluid.   The following portions of the patient's history were reviewed and updated as appropriate: allergies, current medications, past family history, past medical history, past social history, past surgical history and  problem list.   Objective:   Vitals:   05/01/20 0931  BP: 126/78  Pulse: (!) 102   Self-Obtained  Fetal Status:     Movement: Present     Assessment and Plan:  Pregnancy: G2P0010 at [redacted]w[redacted]d 1. Uterine size-date discrepancy in third trimester Last growth [place] c/w dates  2. Rh negative state in antepartum period Given rhogam in December  3. Alpha thalassemia silent carrier   4. Encounter for supervision of low-risk pregnancy in third trimester BP normal Good fetal movement  5. Group B Streptococcus carrier, +RV culture, currently pregnant Discussed rationale for testing and treatment in labor  6. Anxiety and depression Mood stable, declines BH  7. HSV (herpes simplex virus) Taking valtrex, no outbreaks  Term labor symptoms and general obstetric precautions including but not limited to vaginal bleeding, contractions, leaking of fluid and fetal movement were reviewed in detail with the patient.  Return in 1 week (on 05/08/2020) for ob visit.  Future Appointments  Date Time Provider Department Center  05/08/2020 10:35 AM Venora Maples, MD [facility]  05/15/2020 10:35 AM Currie Paris, NP [facility]  05/22/2020  9:35 AM Venora Maples, MD [facility]     Time spent on virtual visit: 10 minutes  Venora Maples, MD

## 2020-05-01 NOTE — Progress Notes (Signed)
I connected with  Judson Roch on 05/01/20 at  9:35 AM EST by telephone and verified that I am speaking with the correct person using two identifiers.   I discussed the limitations, risks, security and privacy concerns of performing an evaluation and management service by telephone and the availability of in person appointments. I also discussed with the patient that there may be a patient responsible charge related to this service. The patient expressed understanding and agreed to proceed.  Ralene Bathe, RN 05/01/2020  9:35 AM

## 2020-05-08 ENCOUNTER — Encounter: Payer: Self-pay | Admitting: Family Medicine

## 2020-05-08 ENCOUNTER — Telehealth (INDEPENDENT_AMBULATORY_CARE_PROVIDER_SITE_OTHER): Payer: 59 | Admitting: Family Medicine

## 2020-05-08 VITALS — BP 140/80 | HR 100

## 2020-05-08 DIAGNOSIS — Z3493 Encounter for supervision of normal pregnancy, unspecified, third trimester: Secondary | ICD-10-CM

## 2020-05-08 DIAGNOSIS — O99820 Streptococcus B carrier state complicating pregnancy: Secondary | ICD-10-CM

## 2020-05-08 DIAGNOSIS — Z6791 Unspecified blood type, Rh negative: Secondary | ICD-10-CM

## 2020-05-08 DIAGNOSIS — O99343 Other mental disorders complicating pregnancy, third trimester: Secondary | ICD-10-CM

## 2020-05-08 DIAGNOSIS — F32A Depression, unspecified: Secondary | ICD-10-CM

## 2020-05-08 DIAGNOSIS — Z3A38 38 weeks gestation of pregnancy: Secondary | ICD-10-CM

## 2020-05-08 DIAGNOSIS — O26899 Other specified pregnancy related conditions, unspecified trimester: Secondary | ICD-10-CM

## 2020-05-08 DIAGNOSIS — O26843 Uterine size-date discrepancy, third trimester: Secondary | ICD-10-CM

## 2020-05-08 DIAGNOSIS — O26893 Other specified pregnancy related conditions, third trimester: Secondary | ICD-10-CM

## 2020-05-08 DIAGNOSIS — F419 Anxiety disorder, unspecified: Secondary | ICD-10-CM

## 2020-05-08 DIAGNOSIS — O98513 Other viral diseases complicating pregnancy, third trimester: Secondary | ICD-10-CM

## 2020-05-08 DIAGNOSIS — D563 Thalassemia minor: Secondary | ICD-10-CM

## 2020-05-08 DIAGNOSIS — B009 Herpesviral infection, unspecified: Secondary | ICD-10-CM

## 2020-05-08 DIAGNOSIS — R030 Elevated blood-pressure reading, without diagnosis of hypertension: Secondary | ICD-10-CM

## 2020-05-08 NOTE — Progress Notes (Signed)
I connected with Amanda Kerr 05/08/20 at 10:35 AM EST by: MyChart video and verified that I am speaking with the correct person using two identifiers.  Patient is located at home and provider is located at [facility].     The purpose of this virtual visit is to provide medical care while limiting exposure to the novel coronavirus. I discussed the limitations, risks, security and privacy concerns of performing an evaluation and management service by MyChart video and the availability of in person appointments. I also discussed with the patient that there may be a patient responsible charge related to this service. By engaging in this virtual visit, you consent to the provision of healthcare.  Additionally, you authorize for your insurance to be billed for the services provided during this visit.  The patient expressed understanding and agreed to proceed.  The following staff members participated in the virtual visit:  Venora Maples, MD/MPH    PRENATAL VISIT NOTE  Subjective:  Amanda Kerr is a 30 y.o. G2P0010 at [redacted]w[redacted]d  for phone visit for ongoing prenatal care.  She is currently monitored for the following issues for this low-risk pregnancy and has Anxiety and depression; History of asthma; Allodynia; HSV (herpes simplex virus); Supervision of low-risk pregnancy; Female pelvic inflammatory disease; Adjustment disorder; Alpha thalassemia silent carrier; Rh negative state in antepartum period; Uterine size date discrepancy; and Group B Streptococcus carrier, +RV culture, currently pregnant on their problem list.  Patient reports carpal tunnel symptoms.  Contractions: Irritability. Vag. Bleeding: None.  Movement: Present. Denies leaking of fluid.   The following portions of the patient's history were reviewed and updated as appropriate: allergies, current medications, past family history, past medical history, past social history, past surgical history and problem list.    Objective:   Vitals:   05/08/20 1044  BP: 140/80  Pulse: 100   Self-Obtained  Fetal Status:     Movement: Present     Assessment and Plan:  Pregnancy: G2P0010 at [redacted]w[redacted]d 1. Uterine size-date discrepancy in third trimester Last growth [place] on 03/18/2020 unremarkable and c/w dates  2. Rh negative state in antepartum period Received rhogam in december  3. Alpha thalassemia silent carrier   4. Encounter for supervision of low-risk pregnancy in third trimester Good fetal movement BP elevated, see separate prolem  5. HSV (herpes simplex virus) On valtrex, no symptoms  6. Group B Streptococcus carrier, +RV culture, currently pregnant   7. Anxiety and depression Mood is mildly anxious but overall stable  8. Elevated blood pressure without diagnosis of hypertension First elevated BP in this pregnancy Will have her come in for in office BP check and labs No symptoms  Term labor symptoms and general obstetric precautions including but not limited to vaginal bleeding, contractions, leaking of fluid and fetal movement were reviewed in detail with the patient.  Return in about 1 week (around 05/15/2020) for [facility], ob visit.  Future Appointments  Date Time Provider Department Center  05/15/2020 10:35 AM Currie Paris, NP [facility]  05/22/2020  9:35 AM Venora Maples, MD [facility]     Time spent on virtual visit: 10 minutes  Venora Maples, MD

## 2020-05-08 NOTE — Patient Instructions (Signed)

## 2020-05-08 NOTE — Progress Notes (Signed)
I connected with  Amanda Kerr on 05/08/20 at 10:35 AM EST by telephone and verified that I am speaking with the correct person using two identifiers.   I discussed the limitations, risks, security and privacy concerns of performing an evaluation and management service by telephone and the availability of in person appointments. I also discussed with the patient that there may be a patient responsible charge related to this service. The patient expressed understanding and agreed to proceed.  Amanda Jarvis, RN 05/08/2020  10:43 AM

## 2020-05-08 NOTE — Progress Notes (Signed)
Pt states having increased swelling in hands and feet with pain in wrist, especially at night. Pt states stopped wearing wrist brace.  Denies headaches, visual changes, but sees occasional spots.

## 2020-05-09 ENCOUNTER — Ambulatory Visit (INDEPENDENT_AMBULATORY_CARE_PROVIDER_SITE_OTHER): Payer: 59

## 2020-05-09 ENCOUNTER — Other Ambulatory Visit: Payer: Self-pay

## 2020-05-09 DIAGNOSIS — R030 Elevated blood-pressure reading, without diagnosis of hypertension: Secondary | ICD-10-CM

## 2020-05-09 NOTE — Progress Notes (Signed)
Pt here today for BP s/p report of elevated BP 140/80's in virtual visit on 05/08/20.  Provider also request to have labs done as well.  Pt reports that she had a headache last night however went away.  No c/o visual disturbances.  BP 116/70.  Labs (CBC, CMP, protein/creatinine ratio) obtained today.  Pt advised to continue to monitor sx's elevated BP and that we will evaluate her at her next visit.  Pt verbalized understanding.    Addison Naegeli, RN  05/09/20

## 2020-05-10 LAB — PROTEIN / CREATININE RATIO, URINE
Creatinine, Urine: 72.5 mg/dL
Protein, Ur: 10.6 mg/dL
Protein/Creat Ratio: 146 mg/g creat (ref 0–200)

## 2020-05-10 LAB — COMPREHENSIVE METABOLIC PANEL
ALT: 8 IU/L (ref 0–32)
AST: 15 IU/L (ref 0–40)
Albumin/Globulin Ratio: 1.2 (ref 1.2–2.2)
Albumin: 3.6 g/dL — ABNORMAL LOW (ref 3.9–5.0)
Alkaline Phosphatase: 192 IU/L — ABNORMAL HIGH (ref 44–121)
BUN/Creatinine Ratio: 12 (ref 9–23)
BUN: 6 mg/dL (ref 6–20)
Bilirubin Total: 0.3 mg/dL (ref 0.0–1.2)
CO2: 17 mmol/L — ABNORMAL LOW (ref 20–29)
Calcium: 9.1 mg/dL (ref 8.7–10.2)
Chloride: 104 mmol/L (ref 96–106)
Creatinine, Ser: 0.49 mg/dL — ABNORMAL LOW (ref 0.57–1.00)
GFR calc Af Amer: 151 mL/min/{1.73_m2} (ref >59)
GFR calc non Af Amer: 131 mL/min/{1.73_m2} (ref >59)
Globulin, Total: 2.9 g/dL (ref 1.5–4.5)
Glucose: 73 mg/dL (ref 65–99)
Potassium: 4.2 mmol/L (ref 3.5–5.2)
Sodium: 135 mmol/L (ref 134–144)
Total Protein: 6.5 g/dL (ref 6.0–8.5)

## 2020-05-10 LAB — CBC
Hematocrit: 33.8 % — ABNORMAL LOW (ref 34.0–46.6)
Hemoglobin: 11.8 g/dL (ref 11.1–15.9)
MCH: 29.4 pg (ref 26.6–33.0)
MCHC: 34.9 g/dL (ref 31.5–35.7)
MCV: 84 fL (ref 79–97)
Platelets: 276 10*3/uL (ref 150–450)
RBC: 4.01 x10E6/uL (ref 3.77–5.28)
RDW: 13.6 % (ref 11.7–15.4)
WBC: 6.9 10*3/uL (ref 3.4–10.8)

## 2020-05-10 NOTE — Progress Notes (Signed)
Patient was assessed and managed by nursing staff during this encounter. I have reviewed the chart and agree with the documentation and plan. I have also made any necessary editorial changes.  Scheryl Darter, MD 05/10/2020 10:43 AM  Patient ID: Amanda Kerr, female   DOB: Mar 22, 1991, 30 y.o.   MRN: [id number]

## 2020-05-12 ENCOUNTER — Other Ambulatory Visit: Payer: Self-pay

## 2020-05-12 ENCOUNTER — Inpatient Hospital Stay (HOSPITAL_COMMUNITY): Payer: 59 | Admitting: Anesthesiology

## 2020-05-12 ENCOUNTER — Encounter (HOSPITAL_COMMUNITY): Payer: Self-pay | Admitting: Family Medicine

## 2020-05-12 ENCOUNTER — Inpatient Hospital Stay (HOSPITAL_COMMUNITY)
Admission: AD | Admit: 2020-05-12 | Discharge: 2020-05-15 | DRG: 807 | Disposition: A | Discharge status: Home / Self Care | Payer: 59 | Attending: Family Medicine | Admitting: Family Medicine

## 2020-05-12 DIAGNOSIS — Z8616 Personal history of COVID-19: Secondary | ICD-10-CM

## 2020-05-12 DIAGNOSIS — O26893 Other specified pregnancy related conditions, third trimester: Secondary | ICD-10-CM | POA: Diagnosis present

## 2020-05-12 DIAGNOSIS — O99820 Streptococcus B carrier state complicating pregnancy: Secondary | ICD-10-CM

## 2020-05-12 DIAGNOSIS — D563 Thalassemia minor: Secondary | ICD-10-CM | POA: Diagnosis present

## 2020-05-12 DIAGNOSIS — O9832 Other infections with a predominantly sexual mode of transmission complicating childbirth: Secondary | ICD-10-CM | POA: Diagnosis present

## 2020-05-12 DIAGNOSIS — Z3493 Encounter for supervision of normal pregnancy, unspecified, third trimester: Secondary | ICD-10-CM

## 2020-05-12 DIAGNOSIS — Z3A38 38 weeks gestation of pregnancy: Secondary | ICD-10-CM

## 2020-05-12 DIAGNOSIS — O99824 Streptococcus B carrier state complicating childbirth: Secondary | ICD-10-CM | POA: Diagnosis present

## 2020-05-12 DIAGNOSIS — F419 Anxiety disorder, unspecified: Secondary | ICD-10-CM | POA: Diagnosis present

## 2020-05-12 DIAGNOSIS — Z6791 Unspecified blood type, Rh negative: Secondary | ICD-10-CM | POA: Diagnosis not present

## 2020-05-12 DIAGNOSIS — O26899 Other specified pregnancy related conditions, unspecified trimester: Secondary | ICD-10-CM

## 2020-05-12 DIAGNOSIS — O26843 Uterine size-date discrepancy, third trimester: Secondary | ICD-10-CM

## 2020-05-12 DIAGNOSIS — F32A Depression, unspecified: Secondary | ICD-10-CM | POA: Diagnosis present

## 2020-05-12 DIAGNOSIS — Z8709 Personal history of other diseases of the respiratory system: Secondary | ICD-10-CM

## 2020-05-12 DIAGNOSIS — B009 Herpesviral infection, unspecified: Secondary | ICD-10-CM | POA: Diagnosis present

## 2020-05-12 DIAGNOSIS — A6000 Herpesviral infection of urogenital system, unspecified: Secondary | ICD-10-CM | POA: Diagnosis present

## 2020-05-12 LAB — TYPE AND SCREEN
ABO/RH(D): A NEG
Antibody Screen: NEGATIVE

## 2020-05-12 LAB — CBC
HCT: 38.6 % (ref 36.0–46.0)
Hemoglobin: 12.4 g/dL (ref 12.0–15.0)
MCH: 28.8 pg (ref 26.0–34.0)
MCHC: 32.1 g/dL (ref 30.0–36.0)
MCV: 89.8 fL (ref 80.0–100.0)
Platelets: 257 10*3/uL (ref 150–400)
RBC: 4.3 MIL/uL (ref 3.87–5.11)
RDW: 14.8 % (ref 11.5–15.5)
WBC: 10.1 10*3/uL (ref 4.0–10.5)
nRBC: 0 % (ref 0.0–0.2)

## 2020-05-12 MED ORDER — PHENYLEPHRINE 40 MCG/ML (10ML) SYRINGE FOR IV PUSH (FOR BLOOD PRESSURE SUPPORT)
80.0000 ug | PREFILLED_SYRINGE | INTRAVENOUS | Status: DC | PRN
  Administered 2020-05-12: 80 ug via INTRAVENOUS

## 2020-05-12 MED ORDER — OXYTOCIN-SODIUM CHLORIDE 30-0.9 UT/500ML-% IV SOLN
2.5000 [IU]/h | INTRAVENOUS | Status: DC
  Administered 2020-05-13: 2.5 [IU]/h via INTRAVENOUS
  Filled 2020-05-12: qty 500

## 2020-05-12 MED ORDER — PENICILLIN G POT IN DEXTROSE 60000 UNIT/ML IV SOLN
3.0000 10*6.[IU] | INTRAVENOUS | Status: DC
  Administered 2020-05-13: 3 10*6.[IU] via INTRAVENOUS
  Filled 2020-05-12 (×4): qty 50

## 2020-05-12 MED ORDER — LIDOCAINE HCL (PF) 1 % IJ SOLN: 30.0000 mL | INTRAMUSCULAR | Status: DC | PRN

## 2020-05-12 MED ORDER — DIPHENHYDRAMINE HCL 50 MG/ML IJ SOLN: 12.5000 mg | INTRAMUSCULAR | Status: DC | PRN

## 2020-05-12 MED ORDER — ACETAMINOPHEN 325 MG PO TABS: 650.0000 mg | ORAL_TABLET | ORAL | Status: DC | PRN

## 2020-05-12 MED ORDER — FENTANYL-BUPIVACAINE-NACL 0.5-0.125-0.9 MG/250ML-% EP SOLN
12.0000 mL/h | EPIDURAL | Status: DC | PRN
  Filled 2020-05-12: qty 250

## 2020-05-12 MED ORDER — FENTANYL CITRATE (PF) 100 MCG/2ML IJ SOLN: 50.0000 ug | Freq: Once | INTRAMUSCULAR | Status: DC

## 2020-05-12 MED ORDER — OXYCODONE-ACETAMINOPHEN 5-325 MG PO TABS: 2.0000 | ORAL_TABLET | ORAL | Status: DC | PRN

## 2020-05-12 MED ORDER — FENTANYL-BUPIVACAINE-NACL 0.5-0.125-0.9 MG/250ML-% EP SOLN
EPIDURAL | Status: DC | PRN
  Administered 2020-05-12: 12 mL/h via EPIDURAL

## 2020-05-12 MED ORDER — LACTATED RINGERS IV SOLN: INTRAVENOUS | Status: DC

## 2020-05-12 MED ORDER — EPHEDRINE 5 MG/ML INJ: 10.0000 mg | INTRAVENOUS | Status: DC | PRN

## 2020-05-12 MED ORDER — LACTATED RINGERS IV SOLN: 500.0000 mL | Freq: Once | INTRAVENOUS | Status: DC

## 2020-05-12 MED ORDER — SODIUM CHLORIDE 0.9 % IV SOLN
5.0000 10*6.[IU] | Freq: Once | INTRAVENOUS | Status: AC
  Administered 2020-05-12: 5 10*6.[IU] via INTRAVENOUS
  Filled 2020-05-12: qty 5

## 2020-05-12 MED ORDER — SOD CITRATE-CITRIC ACID 500-334 MG/5ML PO SOLN: 30.0000 mL | ORAL | Status: DC | PRN

## 2020-05-12 MED ORDER — LACTATED RINGERS IV SOLN
500.0000 mL | INTRAVENOUS | Status: DC | PRN
Start: 2020-05-12 — End: 2020-05-13
  Administered 2020-05-12: 1000 mL via INTRAVENOUS

## 2020-05-12 MED ORDER — ONDANSETRON HCL 4 MG/2ML IJ SOLN: 4.0000 mg | Freq: Four times a day (QID) | INTRAMUSCULAR | Status: DC | PRN

## 2020-05-12 MED ORDER — LIDOCAINE HCL (PF) 1 % IJ SOLN
INTRAMUSCULAR | Status: DC | PRN
  Administered 2020-05-12: 5 mL via EPIDURAL

## 2020-05-12 MED ORDER — OXYCODONE-ACETAMINOPHEN 5-325 MG PO TABS: 1.0000 | ORAL_TABLET | ORAL | Status: DC | PRN

## 2020-05-12 MED ORDER — PHENYLEPHRINE 40 MCG/ML (10ML) SYRINGE FOR IV PUSH (FOR BLOOD PRESSURE SUPPORT)
80.0000 ug | PREFILLED_SYRINGE | INTRAVENOUS | Status: DC | PRN
  Filled 2020-05-12: qty 10

## 2020-05-12 MED ORDER — OXYTOCIN BOLUS FROM INFUSION
333.0000 mL | Freq: Once | INTRAVENOUS | Status: AC
  Administered 2020-05-13: 333 mL via INTRAVENOUS

## 2020-05-12 NOTE — MAU Note (Signed)
Amanda Kerr is a 30 y.o. at [redacted]w[redacted]d here in MAU reporting: contractions since last night, they are now every 2-7 minutes. No bleeding. No LOF. +FM  Onset of complaint: last night  Pain score: 5/10  Vitals:   05/12/20 1630  BP: 121/77  Pulse: 99  Resp: 16  Temp: 99.4 F (37.4 C)  SpO2: 100%     FHT: +FM  Lab orders placed from triage: none

## 2020-05-12 NOTE — Anesthesia Preprocedure Evaluation (Signed)
Anesthesia Evaluation  Patient identified by MRN, date of birth, ID band Patient awake    Reviewed: Allergy & Precautions, NPO status , Patient's Chart, lab work & pertinent test results  Airway Mallampati: II  TM Distance: >3 FB Neck ROM: Full    Dental no notable dental hx. (+) Teeth Intact, Dental Advisory Given   Pulmonary asthma ,    Pulmonary exam normal breath sounds clear to auscultation       Cardiovascular Exercise Tolerance: Good Normal cardiovascular exam Rhythm:Regular Rate:Normal     Neuro/Psych  Headaches, Anxiety    GI/Hepatic negative GI ROS, Neg liver ROS,   Endo/Other  negative endocrine ROS  Renal/GU negative Renal ROS     Musculoskeletal   Abdominal   Peds  Hematology Lab Results      Component                Value               Date                      WBC                      10.1                05/12/2020                HGB                      12.4                05/12/2020                HCT                      38.6                05/12/2020                MCV                      89.8                05/12/2020                PLT                      257                 05/12/2020              Anesthesia Other Findings   Reproductive/Obstetrics (+) Pregnancy                             Anesthesia Physical Anesthesia Plan  ASA: II  Anesthesia Plan: Epidural   Post-op Pain Management:    Induction:   PONV Risk Score and Plan:   Airway Management Planned:   Additional Equipment:   Intra-op Plan:   Post-operative Plan:   Informed Consent: I have reviewed the patients History and Physical, chart, labs and discussed the procedure including the risks, benefits and alternatives for the proposed anesthesia with the patient or authorized representative who has indicated his/her understanding and acceptance.       Plan Discussed with:   Anesthesia  Plan Comments: (38.6 wk G2P0  for LEA)        Anesthesia Quick Evaluation

## 2020-05-12 NOTE — Anesthesia Procedure Notes (Signed)
Epidural Patient location during procedure: OB Start time: 05/12/2020 10:20 PM End time: 05/12/2020 10:36 PM  Staffing Anesthesiologist: Trevor Iha, MD Performed: anesthesiologist   Preanesthetic Checklist Completed: patient identified, IV checked, site marked, risks and benefits discussed, surgical consent, monitors and equipment checked, pre-op evaluation and timeout performed  Epidural Patient position: sitting Prep: DuraPrep and site prepped and draped Patient monitoring: continuous pulse ox and blood pressure Approach: midline Location: L3-L4 Injection technique: LOR air  Needle:  Needle type: Tuohy  Needle gauge: 17 G Needle length: 9 cm and 9 Needle insertion depth: 7 cm Catheter type: closed end flexible Catheter size: 19 Gauge Catheter at skin depth: 12 cm Test dose: negative  Assessment Events: blood not aspirated, injection not painful, no injection resistance, no paresthesia and negative IV test  Additional Notes Patient identified. Risks/Benefits/Options discussed with patient including but not limited to bleeding, infection, nerve damage, paralysis, failed block, incomplete pain control, headache, blood pressure changes, nausea, vomiting, reactions to medication both or allergic, itching and postpartum back pain. Confirmed with bedside nurse the patient's most recent platelet count. Confirmed with patient that they are not currently taking any anticoagulation, have any bleeding history or any family history of bleeding disorders. Patient expressed understanding and wished to proceed. All questions were answered. Sterile technique was used throughout the entire procedure. Please see nursing notes for vital signs. Test dose was given through epidural needle and negative prior to continuing to dose epidural or start infusion. Warning signs of high block given to the patient including shortness of breath, tingling/numbness in hands, complete motor block, or any  concerning symptoms with instructions to call for help. Patient was given instructions on fall risk and not to get out of bed. All questions and concerns addressed with instructions to call with any issues. 1 Attempt (S) . Patient tolerated procedure well.

## 2020-05-12 NOTE — H&P (Addendum)
OBSTETRIC ADMISSION HISTORY AND PHYSICAL  Amanda Kerr is a 30 y.o. female G2P0010 with IUP at [redacted]w[redacted]d by LMP presenting for labor. She reports +FMs, No LOF, no VB, no blurry vision, headaches or peripheral edema, and RUQ pain.  She plans on breast feeding. She requests Mirena IUD for birth control. She received her prenatal [facility]   Dating: By LMP --->  Estimated Date of Delivery: 05/20/20  Sono:  @[redacted]w[redacted]d , CWD, normal anatomy, cephalic presentation,  1976g, 77% EFW  Prenatal History/Complications: HSV on valtrex Anxiety and depression  PID Adjustment disorder Alpha thalassemia silent carrier  Uterine size-date discrepancy in third Trimester: Rh neg status  Past Medical History: Past Medical History:  Diagnosis Date   Asthma    Depression    Frequent headaches    Herpes genitalis in women    UTI (urinary tract infection)     Past Surgical History: Past Surgical History:  Procedure Laterality Date   NO PAST SURGERIES      Obstetrical History: OB History     Gravida  2   Para      Term      Preterm      AB  1   Living         SAB  1   IAB      Ectopic      Multiple      Live Births              Social History Social History   Socioeconomic History   Marital status: Single    Spouse name: Not on file   Number of children: Not on file   Years of education: Not on file   Highest education level: Not on file  Occupational History   Not on file  Tobacco Use   Smoking status: Never Smoker   Smokeless tobacco: Never Used  Vaping Use   Vaping Use: Never used  Substance and Sexual Activity   Alcohol use: Not Currently    Comment: occasional   Drug use: No   Sexual activity: Yes    Birth control/protection: None  Other Topics Concern   Not on file  Social History Narrative   Not on file   Social Determinants of Health   Financial Resource Strain: Not on file  Food Insecurity: No Food Insecurity   Worried  About Running Out of Food in the Last Year: Never true   Ran Out of Food in the Last Year: Never true  Transportation Needs: No Transportation Needs   Lack of Transportation (Medical): No   Lack of Transportation (Non-Medical): No  Physical Activity: Not on file  Stress: Not on file  Social Connections: Not on file    Family History: Family History  Problem Relation Age of Onset   Hypertension Mother    Diabetes Maternal Grandmother    Glaucoma Maternal Grandmother    Heart disease Maternal Grandmother    Hypertension Father    Hyperlipidemia Father    Asthma Sister    Heart disease Paternal Grandmother    Cancer Paternal Grandfather     Allergies: No Known Allergies  Pt denies allergies to latex, iodine, or shellfish.  Medications Prior to Admission  Medication Sig Dispense Refill Last Dose   valACYclovir (VALTREX) 500 MG tablet Take 1 tablet (500 mg total) by mouth 2 (two) times daily. 60 tablet 1 05/12/2020 at Unknown time   Elastic Bandages & Supports (COMFORT FIT MATERNITY SUPP MED)  MISC 1 Units by Does not apply route daily. (Patient not taking: No sig reported) 1 each 0    Prenatal Vit-Fe Fumarate-FA (PRENATAL VITAMIN PO) Take 1 tablet by mouth daily.       Review of Systems   All systems reviewed and negative except as stated in HPI  Blood pressure 121/77, pulse 99, temperature 99.4 F (37.4 C), temperature source Oral, resp. rate 16, height 5\' 8"  (1.727 m), weight 95.7 kg, last menstrual period 08/14/2019, SpO2 100 %. General appearance: alert, cooperative and no distress Lungs: non-labored respirations Heart: regular rate  Abdomen: gravid Pelvic: 7/100/-2 with bulging bag, no sores/lesions noted Extremities: Homans sign is negative, no sign of DVT Presentation: cephalic Fetal monitoringBaseline: 150 bpm, Variability: moderate, Accelerations: Reactive and Decelerations: Variable: intermittent Uterine activity: irregular contractions q1-5 min Dilation:  4 Effacement (%): 80,90 Station: -2 Exam by:: [email or web address] RN   Prenatal labs: ABO, Rh: A/Negative/-- (07/30 0953) Antibody: Negative (12/16 1050) Rubella: 3.48 (07/30 0953) RPR: Non Reactive (12/02 0843)  HBsAg: Negative (07/30 0953)  HIV: Non Reactive (12/02 0843)  GBS: Positive/-- (01/25 1018)  2 hr Glucola: passed  Genetic screening:  normal Anatomy 08-29-1982: normal   Prenatal Transfer Tool  Maternal Diabetes: No Genetic Screening: Normal Maternal Ultrasounds/Referrals: Normal Fetal Ultrasounds or other Referrals:  None Maternal Substance Abuse:  No Significant Maternal Medications:  None Significant Maternal Lab Results: None  No results found for this or any previous visit (from the past 24 hour(s)).  Patient Active Problem List   Diagnosis Date Noted   Group B Streptococcus carrier, +RV culture, currently pregnant 04/26/2020   Uterine size date discrepancy 03/19/2020   Rh negative state in antepartum period 03/14/2020   Alpha thalassemia silent carrier 11/17/2019   Supervision of low-risk pregnancy 10/25/2019   Female pelvic inflammatory disease 10/25/2019   Adjustment disorder 10/25/2019   HSV (herpes simplex virus) 09/19/2018   Anxiety and depression 11/09/2017   History of asthma 11/09/2017   Allodynia 11/09/2017    Assessment/Plan:  Amanda Kerr is a 30 y.o. G2P0010 at [redacted]w[redacted]d here for spontaneous onset of labor.   #Labor: Presented to MAU with SVE 2-3 cm. Progressed to 4cm within 2 hours.  Now 7/100/-2.  Had SROM just prior to check with blood tinged fluid, but likely just forebag as a bulging bag was palpable on exam #Pain: Epidural upon patient request #FWB: Cat II for intermittent variables, but still with moderate variability and overall reassuring  #ID: GBS pos, PCN  #MOF: Breast  #MOC: Mirena IUD at postpartum visit #Circ: Desires   #Uterine size-date discrepancy in third trimester: last growth [redacted]w[redacted]d on 03/18/20 unremarkable and c/w dates  #Rh  neg: Received Rhogam 12/21, will received 2nd dose postpartum #HSV: On Valtrex. Asymptomatic and no lesions seen on external or spec exam #Alpha thalassemia silent carrier  #Elevated BP: 140/80 on 05/08/20 visit. Will monitor. #Anxiety and depression: Not on medication. SW consult PP   EMILY 07/06/20, MD  05/12/2020, 8:46 PM  Attestation of Supervision of Student:  I confirm that I have verified the information documented in the  resident's  note and that I have also personally reperformed the history, physical exam and all medical decision making activities.  I have verified that all services and findings are accurately documented in this student's note; and I agree with management and plan as outlined in the documentation. I have also made any necessary editorial changes.  05/14/2020, MD [facility], [facility] 05/13/2020 9:38 AM

## 2020-05-13 ENCOUNTER — Encounter (HOSPITAL_COMMUNITY): Payer: Self-pay | Admitting: Family Medicine

## 2020-05-13 DIAGNOSIS — Z3A38 38 weeks gestation of pregnancy: Secondary | ICD-10-CM

## 2020-05-13 DIAGNOSIS — O99820 Streptococcus B carrier state complicating pregnancy: Secondary | ICD-10-CM | POA: Diagnosis not present

## 2020-05-13 LAB — RPR: RPR Ser Ql: NONREACTIVE

## 2020-05-13 MED ORDER — ZOLPIDEM TARTRATE 5 MG PO TABS: 5.0000 mg | ORAL_TABLET | Freq: Every evening | ORAL | Status: DC | PRN

## 2020-05-13 MED ORDER — COCONUT OIL OIL: 1.0000 "application " | TOPICAL_OIL | Status: DC | PRN

## 2020-05-13 MED ORDER — DIPHENHYDRAMINE HCL 25 MG PO CAPS: 25.0000 mg | ORAL_CAPSULE | Freq: Four times a day (QID) | ORAL | Status: DC | PRN

## 2020-05-13 MED ORDER — TETANUS-DIPHTH-ACELL PERTUSSIS 5-2.5-18.5 LF-MCG/0.5 IM SUSY: 0.5000 mL | PREFILLED_SYRINGE | Freq: Once | INTRAMUSCULAR | Status: DC

## 2020-05-13 MED ORDER — PRENATAL MULTIVITAMIN CH
1.0000 | ORAL_TABLET | Freq: Every day | ORAL | Status: DC
  Administered 2020-05-13 – 2020-05-15 (×3): 1 via ORAL
  Filled 2020-05-13 (×3): qty 1

## 2020-05-13 MED ORDER — IBUPROFEN 600 MG PO TABS
600.0000 mg | ORAL_TABLET | Freq: Four times a day (QID) | ORAL | Status: DC
  Administered 2020-05-13 – 2020-05-15 (×9): 600 mg via ORAL
  Filled 2020-05-13 (×10): qty 1

## 2020-05-13 MED ORDER — SIMETHICONE 80 MG PO CHEW: 80.0000 mg | CHEWABLE_TABLET | ORAL | Status: DC | PRN

## 2020-05-13 MED ORDER — ONDANSETRON HCL 4 MG/2ML IJ SOLN: 4.0000 mg | INTRAMUSCULAR | Status: DC | PRN

## 2020-05-13 MED ORDER — WITCH HAZEL-GLYCERIN EX PADS: 1.0000 "application " | MEDICATED_PAD | CUTANEOUS | Status: DC | PRN

## 2020-05-13 MED ORDER — DIBUCAINE (PERIANAL) 1 % EX OINT: 1.0000 "application " | TOPICAL_OINTMENT | CUTANEOUS | Status: DC | PRN

## 2020-05-13 MED ORDER — BENZOCAINE-MENTHOL 20-0.5 % EX AERO: 1.0000 "application " | INHALATION_SPRAY | CUTANEOUS | Status: DC | PRN

## 2020-05-13 MED ORDER — ACETAMINOPHEN 325 MG PO TABS
650.0000 mg | ORAL_TABLET | ORAL | Status: DC
  Administered 2020-05-13 – 2020-05-15 (×12): 650 mg via ORAL
  Filled 2020-05-13 (×12): qty 2

## 2020-05-13 MED ORDER — ONDANSETRON HCL 4 MG PO TABS: 4.0000 mg | ORAL_TABLET | ORAL | Status: DC | PRN

## 2020-05-13 MED ORDER — SENNOSIDES-DOCUSATE SODIUM 8.6-50 MG PO TABS
2.0000 | ORAL_TABLET | Freq: Every day | ORAL | Status: DC
  Administered 2020-05-14 – 2020-05-15 (×2): 2 via ORAL
  Filled 2020-05-13 (×2): qty 2

## 2020-05-13 NOTE — Social Work (Signed)
CSW received consult for hx of Depression.  CSW met with MOB to offer support and complete assessment.     CSW introduced self and role. CSW observed MOB holding newborn Elijah and FOB also present. MOB declined having FOB leave the room for assessment. CSW informed MOB of reason for consult. MOB was understanding and reported she experienced some depression years ago. MOB stated she could not recall when she was first diagnosed. MOB disclosed she was prescribed medication, which she never took. MOB reported she had a good pregnancy and is currently feeling good. MOB denies any current SI, HI or being involved in DV.   CSW provided education regarding the baby blues period vs. perinatal mood disorders, discussed treatment and gave resources for mental health follow up if concerns arise.  CSW recommended self-evaluation during the postpartum time period using the New Mom Checklist from Postpartum Progress and encouraged MOB to contact a medical professional if symptoms are noted at any time.   CSW provided review of Sudden Infant Death Syndrome (SIDS) precautions.  MOB reported she has all essential needs for baby. MOB identified [facility] for follow-up care and denies any barriers to care. MOB expressed no additional needs at this time.   CSW identifies no further need for intervention and no barriers to discharge at this time.  Darra Lis, [place] [employer] [phone]

## 2020-05-13 NOTE — Anesthesia Postprocedure Evaluation (Signed)
Anesthesia Post Note  Patient: Amanda Kerr  Procedure(s) Performed: AN AD HOC LABOR EPIDURAL     Patient location during evaluation: Mother Baby Anesthesia Type: Epidural Level of consciousness: awake and alert Pain management: pain level controlled Vital Signs Assessment: post-procedure vital signs reviewed and stable Respiratory status: spontaneous breathing, nonlabored ventilation and respiratory function stable Cardiovascular status: stable Postop Assessment: no headache, no backache and epidural receding Anesthetic complications: no   No complications documented.  Last Vitals:  Vitals:   05/13/20 0430 05/13/20 0812  BP: 130/90 114/71  Pulse: 77 84  Resp: 18 17  Temp: 37.2 C 36.7 C  SpO2: 100%     Last Pain:  Vitals:   05/13/20 0812  TempSrc: Oral  PainSc: 0-No pain   Pain Goal:                Epidural/Spinal Function Cutaneous sensation: Normal sensation (05/13/20 0812), Patient able to flex knees: Yes (05/13/20 5916), Patient able to lift hips off bed: Yes (05/13/20 0812), Back pain beyond tenderness at insertion site: No (05/13/20 0812), Progressively worsening motor and/or sensory loss: No (05/13/20 3846), Bowel and/or bladder incontinence post epidural: No (05/13/20 6599)  Melvyn Hommes

## 2020-05-13 NOTE — Lactation Note (Signed)
This note was copied from a baby's chart. Lactation Consultation Note  Patient Name: Amanda Kerr [id number] Date: 05/13/2020 Reason for consult: Initial assessment;Primapara;1st time breastfeeding;Term Age:31 hours - start of the consult was at 1050.  Baby already latched on the left breast, cradle shallow.  LC re- adjusted position of baby while latched and increased depth noted with increased swallows. Baby fed for 30 mins , took a break and then was rooting.  LC offered to show mom the football position on the right breast , noted a short shaft nipple , with some areola edema . LC reviewed reverse pressure and the areola soften, feeding was 10 mins, on and off.  LC Plan :  Breast shells while awake in between feedings.  Prior to latch - breast massage , hand express, pre-pump with the hand pump,  And reverse pressure.  Steps to prevent soreness and elongate the nipple / areola complex for a deeper latch.  LC provided the [facility] brochure with resource numbers after D/C .  See below for teaching / education.   Maternal Data Has patient been taught Hand Expression?: Yes Does the patient have breastfeeding experience prior to this delivery?: No  Feeding Mother's Current Feeding Choice: Breast Milk  LATCH Score Latch:  (latched with depth)  Audible Swallowing:  (swallows noted)  Type of Nipple:  (nipple slightly slanted when baby released)  Comfort (Breast/Nipple):  (per mom more comfortable when the baby realigned)  Hold (Positioning):  (per mom baby latched on his own)      Lactation Tools Discussed/Used Shells  Hand pump  Set up and cleaning.     Interventions Interventions: Breast feeding basics reviewed;Assisted with latch;Skin to skin;Breast massage;Hand express;Adjust position;Support pillows;Position options  Education on the use shells , hand pump, hand expressing.   Discharge Pump: Personal WIC Program: Yes  Consult Status Consult Status: Follow-up Date:  05/14/20 Follow-up type: In-patient    Amanda Kerr 05/13/2020, 11:07 AM

## 2020-05-14 MED ORDER — IBUPROFEN 600 MG PO TABS: 600.0000 mg | ORAL_TABLET | Freq: Four times a day (QID) | ORAL | 0 refills | Status: DC

## 2020-05-14 MED ORDER — ACETAMINOPHEN 325 MG PO TABS: 650.0000 mg | ORAL_TABLET | Freq: Four times a day (QID) | ORAL | Status: DC

## 2020-05-14 NOTE — Lactation Note (Signed)
This note was copied from a baby's chart. Lactation Consultation Note  Patient Name: Amanda Kerr FHLKT'G Date: 05/14/2020 Reason for consult: Follow-up assessment;Primapara;1st time breastfeeding;Term Age:31 hours Upon entering room mom sitting on bed, dad sitting on couch holding baby. Mom states would like to only feed baby breast milk either by latching or via bottle, but is planning to use formula for now d/t feeding difficulty. Reports last breast feed occurred yesterday ~7pm. Cracks and bleeding noted to left nipple, right nipple intact. Mom reports with a DEBP at home, states peds office has a Advertising copywriter.   Mom set up with DEBP, agreed to use q3hrs for stimulation if baby is not latching to breast. Mom pumped ~22mins then requested assistance with latch. Baby observed with tongue restriction, tongue makes U-shape on elevation, short sublingual frenulum noted. Baby up to right breast cross cradle hold, baby with shallow latch (tight angle pursed lips) and unable to maintain latch longer than 20-30 seconds. Mom fitted for 84mm nipple shield and demonstrated how to apply, baby latched without difficulty, frequent swallows noted, evidence of colostrum noted to tip of shield on release after ~90mins. Mom attempted to nurse on left breast but discontinued d/t pain. EBM and Comfort Gel applied to damaged area.   Discussed cue based feedings, nipple shield for each feeding, 8-12 feedings every 24hrs, wake if >3hrs since last feeding, follow Formula Feeding Sheet for appropriate volume when baby is not at breast/ bottle feeds only, reviewed pump setup frequency and cleaning, skin to skin, engorgement and how to manage, community support groups, [facility] BF brochure with numbers for LC support. Reinforced pump is for stimulation at this time. Mom voiced understanding and with no further concerns. Mom received a call from [facility], peer counselor Carley Hammed states mom with f/u call tomorrow, will f/u regarding  use of NS. Left the room with mom sitting in bed holding sleeping baby. BGilliam, RN, IBCLC   Feeding Mother's Current Feeding Choice: Breast Milk and Formula Nipple Type: Slow - flow  LATCH Score Latch: Grasps breast easily, tongue down, lips flanged, rhythmical sucking.  Audible Swallowing: Spontaneous and intermittent  Type of Nipple: Everted at rest and after stimulation  Comfort (Breast/Nipple): Engorged, cracked, bleeding, large blisters, severe discomfort  Hold (Positioning): Assistance needed to correctly position infant at breast and maintain latch.  LATCH Score: 7   Lactation Tools Discussed/Used Tools: Nipple Ventura Sellers Size: 24 Pump Education: Setup, frequency, and cleaning Reason for Pumping: stimulation/ nipple shield use  Interventions Interventions: Breast feeding basics reviewed;Assisted with latch;Skin to skin;Breast compression;Adjust position;Support pillows;DEBP;Comfort gels  Discharge    Consult Status Consult Status: Follow-up Date: 05/15/20 Follow-up type: In-patient    Amanda Kerr 05/14/2020, 10:25 AM

## 2020-05-14 NOTE — Discharge Summary (Addendum)
Postpartum Discharge Summary  Patient Name: Amanda Kerr DOB: 11/19/1990 MRN: [id number]  Date of admission: 05/12/2020 Delivery date:05/13/2020  Delivering provider: Lenna Sciara  Date of discharge: 05/14/2020  Admitting diagnosis: Indication for care in labor and delivery, antepartum [O75.9] Intrauterine pregnancy: [redacted]w[redacted]d    Secondary diagnosis:  Principal Problem:   Vaginal delivery Active Problems:   Anxiety and depression   History of asthma   HSV (herpes simplex virus)   Alpha thalassemia silent carrier   Rh negative state in antepartum period   Group B Streptococcus carrier, +RV culture, currently pregnant   Indication for care in labor and delivery, antepartum  Additional problems: as noted above   Discharge diagnosis: Term Vaginal Delivery                                          Post partum procedures: None Augmentation:  none Complications: None  Hospital course: Onset of Labor With Vaginal Delivery      30y.o. yo G2P1011 at 354w0das admitted in Latent Labor on 05/12/2020. Patient had an uncomplicated labor course as follows:  Membrane Rupture Time/Date: 11:15 PM ,05/12/2020   Delivery Method:Vaginal, Spontaneous  Episiotomy: None  Lacerations:    Patient had an uncomplicated postpartum course.  She is ambulating, tolerating a regular diet, passing flatus, and urinating well. Patient is discharged home in stable condition on 05/14/20.  Newborn Data: Birth date:05/13/2020  Birth time:1:09 AM  Gender:Female  Living status:Living  Apgars:9 ,9  Weight:7 lb 12.5 oz (3.53 kg)   Magnesium Sulfate received: No BMZ received: No Rhophylac:Yes MMR:N/A T-DaP:Given prenatally Flu: Yes  Transfusion:Yes  Physical exam  Vitals:   05/13/20 0430 05/13/20 0812 05/13/20 1202 05/13/20 1620  BP: 130/90 114/71 110/81 110/75  Pulse: 77 84 80 83  Resp: _0 Temp: 98.9 F (37.2 C) 98 F (36.7 C) 98.4 F (36.9 C) 98.5 F (36.9 C)  TempSrc: Oral Oral Oral  Oral  SpO2: 100%  100%   Weight:      Height:       General: alert, cooperative and no distress Lochia: appropriate Uterine Fundus: firm Incision: N/A DVT Evaluation: No evidence of DVT seen on physical exam. Labs: Lab Results  Component Value Date   WBC 10.1 05/12/2020   HGB 12.4 05/12/2020   HCT 38.6 05/12/2020   MCV 89.8 05/12/2020   PLT 257 05/12/2020   CMP Latest Ref Rng & Units 05/09/2020  Glucose 65 - 99 mg/dL 73  BUN 6 - 20 mg/dL 6  Creatinine 0.57 - 1.00 mg/dL 0.49(L)  Sodium 134 - 144 mmol/L 135  Potassium 3.5 - 5.2 mmol/L 4.2  Chloride 96 - 106 mmol/L 104  CO2 20 - 29 mmol/L 17(L)  Calcium 8.7 - 10.2 mg/dL 9.1  Total Protein 6.0 - 8.5 g/dL 6.5  Total Bilirubin 0.0 - 1.2 mg/dL 0.3  Alkaline Phos 44 - 121 IU/L 192(H)  AST 0 - 40 IU/L 15  ALT 0 - 32 IU/L 8   Edinburgh Score: Edinburgh Postnatal Depression Scale Screening Tool 05/13/2020  I have been able to laugh and see the funny side of things. 0  I have looked forward with enjoyment to things. 0  I have blamed myself unnecessarily when things went wrong. 1  I have been anxious or worried for no good reason. 2  I have felt scared  or panicky for no good reason. 1  Things have been getting on top of me. 1  I have been so unhappy that I have had difficulty sleeping. 1  I have felt sad or miserable. 1  I have been so unhappy that I have been crying. 0  The thought of harming myself has occurred to me. 0  Edinburgh Postnatal Depression Scale Total 7     After visit meds:  Allergies as of 05/14/2020   No Known Allergies      Medication List     STOP taking these medications    valACYclovir 500 MG tablet Commonly known as: Valtrex       TAKE these medications    acetaminophen 325 MG tablet Commonly known as: Tylenol Take 2 tablets (650 mg total) by mouth every 6 (six) hours. What changed:  when to take this reasons to take this   Comfort Fit Maternity Supp Med Misc 1 Units by Does not apply  route daily.   ibuprofen 600 MG tablet Commonly known as: ADVIL Take 1 tablet (600 mg total) by mouth every 6 (six) hours.   prenatal multivitamin Tabs tablet Take 1 tablet by mouth daily at 12 noon.          Discharge home in stable condition Infant Feeding: Breast Infant Disposition:home with mother Discharge instruction: per After Visit Summary and Postpartum booklet. Activity: Advance as tolerated. Pelvic rest for 6 weeks.  Diet: routine diet Future Appointments:No future appointments. Follow up Visit:  Message sent to [facility] on 05/13/20 to schedule PP appt.  Please schedule this patient for a In person postpartum visit in 6 weeks with the following provider: Any provider. Additional Postpartum F/U:Postpartum Depression checkup 1 week Low risk pregnancy complicated by:  HSV on valtrex, Anxiety/Depression (no meds), COVID in pregnancy, Rh negative, Asthma Delivery mode:  Vaginal, Spontaneous  Anticipated Birth Control:   plan for outpatient Mirena IUD  Goswick, Anna E, MD OB Fellow, Faculty Practice 05/14/2020 8:53 AM  

## 2020-05-14 NOTE — Discharge Instructions (Signed)

## 2020-05-14 NOTE — Progress Notes (Addendum)
POSTPARTUM PROGRESS NOTE  Subjective: Amanda Kerr is a 30 y.o. G2P1011 s/p SVD at [redacted]w[redacted]d after SOL.  She reports she doing well. No acute events overnight. She denies any problems with ambulating, voiding or po intake. Denies nausea or vomiting. ain is well controlled.  Lochia is less than menses.  Objective: Blood pressure 110/75, pulse 83, temperature 98.5 F (36.9 C), temperature source Oral, resp. rate 16, height 5\' 8"  (1.727 m), weight 95.7 kg, last menstrual period 08/14/2019, SpO2 100 %, unknown if currently breastfeeding.  Physical Exam:  General: alert, cooperative and no distress Chest: no respiratory distress Abdomen: soft, non-tender  Uterine Fundus: firm, appropriately tender Extremities: No calf swelling or tenderness  no edema  Recent Labs    05/12/20 1917  HGB 12.4  HCT 38.6    Assessment/Plan: Amanda Kerr is a 30 y.o. G2P1011 s/p SVD at [redacted]w[redacted]d after SOL.   Routine Postpartum Care: Doing well, pain well-controlled.  -Continue routine care, lactation support  -Contraception: Mirena at postpartum visit  -Feeding: Breast  -Circ: Desires   Rh neg: Received Rhogam 12/21 -Rhogam 2nd dose prior to d/c  Anxiety and depression: Not on medication.  -SW consulted, no barriers to discharge  Dispo: Plan for discharge tomorrow.  1/22, MD 05/14/2020, 6:28 AM

## 2020-05-15 ENCOUNTER — Telehealth: Payer: 59 | Admitting: Nurse Practitioner

## 2020-05-15 NOTE — Lactation Note (Signed)
This note was copied from a baby's chart. Lactation Consultation Note  Patient Name: Boy Meron Bocchino ZRAQT'M Date: 05/15/2020 Reason for consult: Follow-up assessment Age:31 hours  P1 mother whose infant is now 48 hours old.  This is a term baby at 39+0 weeks.  Per LC note from yesterday, mother desires to formula feed only at this time.  Her nipples were cracked and bleeding yesterday.  Upon observation today, mother's nipples are not cracked or bleeding.  Nipple is intact without trauma.  I provided coconut oil for comfort with instructions for use.  Mother has not pumped with the DEBP since 0100.  Reviewed the importance of pumping frequently, at least every three hours.  Mother verbalized understanding.  Set mother up now with the DEBP and observed her pumping.  Coconut oil used to lubricate her nipples/areolas and the flanges.  Mother had no complaints of pain with the DEBP.  RN in room during my visit to complete discharge papers with mother.  Baby will need to have his circumcision observed at 1330 and parents will be allowed to be discharged after this time.  Mother has a DEBP for home use.  Father present.    Maternal Data    Feeding    LATCH Score                    Lactation Tools Discussed/Used    Interventions    Discharge    Consult Status Consult Status: Complete Date: 05/15/20 Follow-up type: Call as needed    Irene Pap Rishabh Rinkenberger 05/15/2020, 12:21 PM

## 2020-05-15 NOTE — Discharge Summary (Signed)
Postpartum Discharge Summary       Patient Name: Amanda Kerr DOB: 08-25-1990 MRN: [id number]  Date of admission: 05/12/2020 Delivery date:05/13/2020  Delivering provider: Lenna Sciara  Date of discharge: 05/15/2020  Admitting diagnosis: Indication for care in labor and delivery, antepartum [O75.9] Intrauterine pregnancy: [redacted]w[redacted]d    Secondary diagnosis:  Principal Problem:   Vaginal delivery Active Problems:   Anxiety and depression   History of asthma   HSV (herpes simplex virus)   Alpha thalassemia silent carrier   Rh negative state in antepartum period   Group B Streptococcus carrier, +RV culture, currently pregnant   Indication for care in labor and delivery, antepartum  Additional problems: none    Discharge diagnosis: Term Pregnancy Delivered                                              Post partum procedures:none Augmentation: none Complications: None  Hospital course: Onset of Labor With Vaginal Delivery      30y.o. yo G2P1011 at 360w0das admitted in Active Labor on 05/12/2020. Patient had an uncomplicated labor course as follows:  Membrane Rupture Time/Date: 11:15 PM ,05/12/2020   Delivery Method:Vaginal, Spontaneous  Episiotomy: None  Lacerations:    Patient had an uncomplicated postpartum course.  She is ambulating, tolerating a regular diet, passing flatus, and urinating well. Patient is discharged home in stable condition on 05/15/20.  Hx hsv, suppressed, no signs lesions active disease prior to delivery. Recent covid, recovered, asymptomatic.   Newborn Data: Birth date:05/13/2020  Birth time:1:09 AM  Gender:Female  Living status:Living  Apgars:9 ,9  Weight:3530 g   Magnesium Sulfate received: No BMZ received: No Rhophylac:No, baby rh neg MMR:No T-DaP:Given prenatally Transfusion:No  Physical exam  Vitals:   05/13/20 1202 05/13/20 1620 05/14/20 2026 05/15/20 0509  BP: 110/81 110/75 126/89 117/81  Pulse: 80 83 96 92  Resp: '16 16 18 18   ' Temp: 98.4 F (36.9 C) 98.5 F (36.9 C) 97.8 F (36.6 C) 98.3 F (36.8 C)  TempSrc: Oral Oral Oral Oral  SpO2: 100%  100% 100%  Weight:      Height:       General: alert, cooperative and no distress Lochia: appropriate Uterine Fundus: firm Incision: N/A DVT Evaluation: No evidence of DVT seen on physical exam. Labs: Lab Results  Component Value Date   WBC 10.1 05/12/2020   HGB 12.4 05/12/2020   HCT 38.6 05/12/2020   MCV 89.8 05/12/2020   PLT 257 05/12/2020   CMP Latest Ref Rng & Units 05/09/2020  Glucose 65 - 99 mg/dL 73  BUN 6 - 20 mg/dL 6  Creatinine 0.57 - 1.00 mg/dL 0.49(L)  Sodium 134 - 144 mmol/L 135  Potassium 3.5 - 5.2 mmol/L 4.2  Chloride 96 - 106 mmol/L 104  CO2 20 - 29 mmol/L 17(L)  Calcium 8.7 - 10.2 mg/dL 9.1  Total Protein 6.0 - 8.5 g/dL 6.5  Total Bilirubin 0.0 - 1.2 mg/dL 0.3  Alkaline Phos 44 - 121 IU/L 192(H)  AST 0 - 40 IU/L 15  ALT 0 - 32 IU/L 8   Edinburgh Score: Edinburgh Postnatal Depression Scale Screening Tool 05/13/2020  I have been able to laugh and see the funny side of things. 0  I have looked forward with enjoyment to things. 0  I have blamed myself unnecessarily when things  went wrong. 1  I have been anxious or worried for no good reason. 2  I have felt scared or panicky for no good reason. 1  Things have been getting on top of me. 1  I have been so unhappy that I have had difficulty sleeping. 1  I have felt sad or miserable. 1  I have been so unhappy that I have been crying. 0  The thought of harming myself has occurred to me. 0  Edinburgh Postnatal Depression Scale Total 7     After visit meds:  Allergies as of 05/15/2020   No Known Allergies     Medication List    STOP taking these medications   valACYclovir 500 MG tablet Commonly known as: Valtrex     TAKE these medications   acetaminophen 325 MG tablet Commonly known as: Tylenol Take 2 tablets (650 mg total) by mouth every 6 (six) hours. What changed:   when  to take this  reasons to take this   [place]ett 1 Units by Does not apply route daily.   ibuprofen 600 MG tablet Commonly known as: ADVIL Take 1 tablet (600 mg total) by mouth every 6 (six) hours.   prenatal multivitamin Tabs tablet Take 1 tablet by mouth daily at 12 noon.        Discharge home in stable condition Infant Feeding: Breast Infant Disposition:home with mother Discharge instruction: per After Visit Summary and Postpartum booklet. Activity: Advance as tolerated. Pelvic rest for 6 weeks.  Diet: routine diet Future Appointments:No future appointments. Follow up Visit:   Please schedule this patient for a In person postpartum visit in 6 weeks with the following provider: Any provider. Additional Postpartum F/U:none  Low risk pregnancy complicated by: none Delivery mode:  Vaginal, Spontaneous  Anticipated Birth Control:  IUD   05/15/2020 Desma Maxim, MD

## 2020-05-17 NOTE — BH Specialist Note (Deleted)
Integrated Behavioral Health via Telemedicine Visit  05/17/2020 Amanda Kerr [id number]  Number of Integrated Behavioral Health visits: 1 Session Start time: 8:15***  Session End time: 9:15*** Total time: {IBH Total Time:21014050}  Referring Provider: *** Patient/Family location: Home*** [name] location: [facility]  All persons participating in visit: Patient *** and [facility] ***  Types of Service: {CHL AMB TYPE OF SERVICE:[id number]}  I connected with Judson Roch and/or Luz Brazen Skibinski's {family members:20773} by Telephone  (Video is Caregility application) and verified that I am speaking with the correct person using two identifiers.Discussed confidentiality: {YES/NO:21197}  I discussed the limitations of telemedicine and the availability of in person appointments.  Discussed there is a possibility of technology failure and discussed alternative modes of communication if that failure occurs.  I discussed that engaging in this telemedicine visit, they consent to the provision of behavioral healthcare and the services will be billed under their insurance.  Patient and/or legal guardian expressed understanding and consented to Telemedicine visit: {YES/NO:21197}  Presenting Concerns: Patient and/or family reports the following symptoms/concerns: *** Duration of problem: ***; Severity of problem: {Mild/Moderate/Severe:20260}  Patient and/or Family's Strengths/Protective Factors: {CHL AMB BH PROTECTIVE FACTORS:[phone]}  Goals Addressed: Patient will: 1.  Reduce symptoms of: {IBH Symptoms:21014056}  2.  Increase knowledge and/or ability of: {IBH Patient Tools:21014057}  3.  Demonstrate ability to: {IBH Goals:21014053}  Progress towards Goals: {CHL AMB BH PROGRESS TOWARDS [phone]}  Interventions: Interventions utilized:  {IBH Interventions:21014054} Standardized Assessments  completed: {IBH Screening Tools:21014051}  Patient and/or Family Response: ***  Assessment: Patient currently experiencing ***.   Patient may benefit from ***.  Plan: 1. Follow up with behavioral health clinician on : *** 2. Behavioral recommendations: *** 3. Referral(s): {IBH Referrals:21014055}  I discussed the assessment and treatment plan with the patient and/or parent/guardian. They were provided an opportunity to ask questions and all were answered. They agreed with the plan and demonstrated an understanding of the instructions.   They were advised to call back or seek an in-person evaluation if the symptoms worsen or if the condition fails to improve as anticipated.  Valetta Close Skarlet Lyons, LCSW

## 2020-05-21 NOTE — BH Specialist Note (Signed)
Integrated Behavioral Health via Telemedicine Visit  05/21/2020 Amanda Kerr [id number]  Number of Integrated Behavioral Health visits: 1 Session Start time: 10:22  Session End time: 10:43 Total time: 21  Referring Provider: Lynnda Shields, MD Patient/Family location: Home [facility] Provider location: [facility]  All persons participating in visit: Patient Amanda Kerr and [facility] Kalee Broxton   Types of Service: Individual psychotherapy and Video visit  I connected with Judson Roch and/or Amanda Kerr's n/a by Video  (Video is Caregility application) and verified that I am speaking with the correct person using two identifiers.Discussed confidentiality: Yes   I discussed the limitations of telemedicine and the availability of in person appointments.  Discussed there is a possibility of technology failure and discussed alternative modes of communication if that failure occurs.  I discussed that engaging in this telemedicine visit, they consent to the provision of behavioral healthcare and the services will be billed under their insurance.  Patient and/or legal guardian expressed understanding and consented to Telemedicine visit: Yes   Presenting Concerns: Patient and/or family reports the following symptoms/concerns: Pt states her primary symptoms are lack of quality sleep, irritability, change in appetite, all attributed to postpartum time; good support at home, and no additional concern.  Duration of problem: Postpartum; Severity of problem: mild  Patient and/or Family's Strengths/Protective Factors: Social connections, Social and Emotional competence, Concrete supports in place (healthy food, safe environments, etc.) and Sense of purpose  Goals Addressed: Patient will: 1.  Increase knowledge and/or ability of: healthy habits  2.  Demonstrate ability to: Increase healthy adjustment to current life  circumstances  Progress towards Goals: Ongoing  Interventions: Interventions utilized:  Solution-Focused Strategies, Psychoeducation and/or Health Education and Link to [employer] Standardized Assessments completed: GAD-7 and PHQ 9  Patient and/or Family Response: Pt agrees with treatment plan  Assessment: Patient currently experiencing Adjustment disorder, unspecified.   Patient may benefit from psychoeducation and brief therapeutic interventions regarding coping with current life adjustment .  Plan: 1. Follow up with behavioral health clinician on : Call Asher Muir as needed at [phone] 2. Behavioral recommendations:  -Continue taking prenatal vitamin until postpartum medical visit -Consider registering for and attending new mom support group at either [email or web address] or www.[email or web address].net -Continue sleeping when baby sleeps; consider setting up container/basket of healthy snacks/water bottles for easy access as needed throughout the day 3. Referral(s): Integrated Art gallery manager (In Clinic) and [employer]:  New mom support  I discussed the assessment and treatment plan with the patient and/or parent/guardian. They were provided an opportunity to ask questions and all were answered. They agreed with the plan and demonstrated an understanding of the instructions.   They were advised to call back or seek an in-person evaluation if the symptoms worsen or if the condition fails to improve as anticipated.  Rae Lips, LCSW   Depression screen [facility] 2/9 06/04/2020 05/01/2020 04/23/2020 04/16/2020 04/01/2020  Decreased Interest 0 0 0 0 0  Down, Depressed, Hopeless 0 0 0 0 0  PHQ - 2 Score 0 0 0 0 0  Altered sleeping 3 0 0 0 0  Tired, decreased energy 1 1 1 1 1   Change in appetite 2 0 0 0 0  Feeling bad or failure about yourself  0 0 0 0 0  Trouble concentrating 0 0 0 0 1  Moving slowly or fidgety/restless 0 0 0 0 0  Suicidal thoughts 0 0 0 0 0  PHQ-9 Score 6 1 1 1 2   Some recent data might be hidden   GAD 7 : Generalized Anxiety Score 06/04/2020 05/01/2020 04/23/2020 04/16/2020  Nervous, Anxious, on Edge 0 0 0 0  Control/stop worrying 0 0 0 0  Worry too much - different things 0 0 0 0  Trouble relaxing 0 0 0 0  Restless 0 0 0 0  Easily annoyed or irritable 2 0 1 0  Afraid - awful might happen 0 0 0 0  Total GAD 7 Score 2 0 1 0  Anxiety Difficulty - - - -

## 2020-05-22 ENCOUNTER — Encounter: Payer: 59 | Admitting: Family Medicine

## 2020-06-04 ENCOUNTER — Ambulatory Visit (INDEPENDENT_AMBULATORY_CARE_PROVIDER_SITE_OTHER): Payer: 59 | Admitting: Clinical

## 2020-06-04 DIAGNOSIS — F4320 Adjustment disorder, unspecified: Secondary | ICD-10-CM

## 2020-06-04 NOTE — Patient Instructions (Signed)
[facility] [address] [place], [place] [address] [phone] (main office) [phone] (Khup Sapia's office)  New mom support:  [email or web address]  [email or web address]

## 2020-06-25 ENCOUNTER — Encounter: Payer: Self-pay | Admitting: Medical

## 2020-06-25 ENCOUNTER — Ambulatory Visit (INDEPENDENT_AMBULATORY_CARE_PROVIDER_SITE_OTHER): Payer: 59 | Admitting: Medical

## 2020-06-25 ENCOUNTER — Other Ambulatory Visit: Payer: Self-pay

## 2020-06-25 DIAGNOSIS — Z3202 Encounter for pregnancy test, result negative: Secondary | ICD-10-CM | POA: Diagnosis not present

## 2020-06-25 LAB — POCT PREGNANCY, URINE: Preg Test, Ur: NEGATIVE

## 2020-06-25 NOTE — Progress Notes (Signed)
Post Partum Visit Note  Amanda Kerr is a 30 y.o. G80P1011 female who presents for a postpartum visit. She is 6 weeks postpartum following a normal spontaneous vaginal delivery.  I have fully reviewed the prenatal and intrapartum course. The delivery was at 39 gestational weeks.  Anesthesia: epidural. Postpartum course has been normal. Baby is doing well. Baby is feeding by bottle - Gerber Gentle. Bleeding no bleeding. Bowel function is normal. Bladder function is normal. Patient is sexually active. Contraception method is condoms. Postpartum depression screening: negative.   The pregnancy intention screening data noted above was reviewed. Potential methods of contraception were discussed. The patient elected to proceed with IUD or IUS.    Edinburgh Postnatal Depression Scale - 06/25/20 1049      Edinburgh Postnatal Depression Scale:  In the Past 7 Days   I have been able to laugh and see the funny side of things. 0    I have looked forward with enjoyment to things. 0    I have blamed myself unnecessarily when things went wrong. 1    I have been anxious or worried for no good reason. 2    I have felt scared or panicky for no good reason. 1    Things have been getting on top of me. 0    I have been so unhappy that I have had difficulty sleeping. 0    I have felt sad or miserable. 2    I have been so unhappy that I have been crying. 0    The thought of harming myself has occurred to me. 0    Edinburgh Postnatal Depression Scale Total 6            The following portions of the patient's history were reviewed and updated as appropriate: allergies, current medications, past family history, past medical history, past social history, past surgical history and problem list.  Review of Systems Pertinent items are noted in HPI.    Objective:  BP 114/78   Pulse 73   Ht 5\' 8"  (1.727 m)   Wt 193 lb 8 oz (87.8 kg)   LMP  (LMP Unknown)   BMI 29.42 kg/m    General:  alert and  cooperative   Breasts:  deferred  Lungs: clear to auscultation bilaterally  Heart:  regular rate and rhythm, S1, S2 normal, no murmur, click, rub or gallop  Abdomen: soft, non-tender   Vulva:  not evaluated  Vagina: not evaluated  Cervix:  not evaluated  Corpus: not examined  Adnexa:  not evaluated  Rectal Exam: Not performed.        Assessment:    Normal postpartum exam.   Plan:   Essential components of care per ACOG recommendations:  1.  Mood and well being: Patient with negative depression screening today. Reviewed local resources for support.  - Patient does not use tobacco.  - hx of drug use? No    2. Infant care and feeding:  -Patient currently breastmilk feeding? No  -Social determinants of health (SDOH) reviewed in EPIC. No concerns  3. Sexuality, contraception and birth spacing - Patient does not want a pregnancy in the next year.  Desired family size is 2 children.  - Reviewed forms of contraception in tiered fashion. Patient desired IUD. Patient does not meet criteria for to be reasonably certain that she is not pregnant today, will schedule for IUD placement in 2 weeks.  - Discussed birth spacing of 18 months  4.  Sleep and fatigue -Encouraged family/partner/community support of 4 hrs of uninterrupted sleep to help with mood and fatigue  5. Physical Recovery  - Discussed patients delivery and complications - Patient had a bilateral labial laceration, perineal healing reviewed. Patient expressed understanding - Patient has urinary incontinence? No  - Patient is safe to resume physical and sexual activity  6.  Health Maintenance - Last pap smear done 05/10/2019 and was normal with negative HPV.  Vonzella Nipple, PA-C Center for [employer], [facility]

## 2020-06-25 NOTE — Patient Instructions (Addendum)
Hormonal Contraception Information Hormonal contraception is a type of birth control that uses hormones to prevent pregnancy. It usually involves a combination of the hormones estrogen and progesterone, or only the hormone progesterone. Hormonal contraception works in these ways:  It thickens the mucus in the cervix, which is the lowest part of the uterus. Thicker mucus makes it harder for sperm to enter the uterus.  It changes the lining of the uterus. This makes it harder for an egg to attach or implant.  It may stop the ovaries from releasing eggs (ovulation). Some women who take hormonal contraceptives that contain only progesterone may continue to ovulate. Hormonal contraception cannot prevent STIs (sexually transmitted infections). Pregnancy may still occur. Types of hormonal contraception Estrogen and progesterone contraceptives Contraceptives that use a combination of estrogen and progesterone are available in these forms:  Pill. Pills come in different combinations of hormones. Pills must be taken at the same time each day. They can affect your period. You can get your period monthly, once every 3 months, or not at all.  Patch. The patch is applied to the buttocks, abdomen, upper outer arm, or back. It is kept in place for 3 weeks. It is removed for the last or fourth week of the cycle.  Vaginal ring. The ring is placed in the vagina and left there for 3 weeks. It is then removed for the last or fourth week of the cycle.   Progesterone-only contraceptives Contraceptives that use only progesterone are available in these forms:  Pill. Pills should be taken at the same time everyday. This is very important to decrease the chance of pregnancy. Pills containing progestin-only are usually taken every day of the cycle. Other types of pills may have a placebo tablet for the last 4 days of every cycle.  Intrauterine device (IUD). This device is inserted through the vagina and cervix into the  uterus. It is removed or replaced every 3 to 5 years, depending on the type. It can be removed sooner.  Implant. Plastic rods are placed under the skin of the upper arm. They are removed or replaced every 3 years. They can be removed sooner.  Shot (injection). The injection is given once every 12 or 13 weeks (about 3 months).   Risks associated with hormonal contraception Estrogen and progesterone contraceptives can sometimes cause side effects, such as:  Nausea.  Headaches.  Breast tenderness.  Bleeding or spotting between menstrual cycles.  High blood pressure (rare).  Strokes, heart attacks, or blood clots (rare). Progesterone-only contraceptives also can have side effects, such as:  Nausea.  Headaches.  Breast tenderness.  Irregular menstrual bleeding.  High blood pressure (rare). Talk to your health care provider about what side effects may mean for you. Questions to ask:  What type of hormonal contraception is right for me?  How long should I plan to use hormonal contraception?  What are the side effects of the hormonal contraception method I choose?  How can I prevent STIs while using hormonal contraception? Where to find more information Ask your health care provider for more information and resources about hormonal contraception. You can also go to:  U.[place] Department of Health and [employer], Office on Women's Health: [place] Summary  Estrogen and progesterone are hormones used in many forms of birth control.  Hormonal contraception cannot prevent STIs (sexually transmitted infections).  Talk to your health care provider about what side effects may mean for you.  Ask your health care provider for more information and  resources about hormonal contraception. This information is not intended to replace advice given to you by your health care provider. Make sure you discuss any questions you have with your health care provider. Document  Revised: 11/22/2019 Document Reviewed: 11/22/2019 Elsevier Patient Education  2021 Elsevier Inc.  

## 2020-06-27 ENCOUNTER — Encounter: Payer: Self-pay | Admitting: Medical

## 2020-07-09 ENCOUNTER — Encounter: Payer: Self-pay | Admitting: Family Medicine

## 2020-07-09 ENCOUNTER — Other Ambulatory Visit: Payer: Self-pay

## 2020-07-09 ENCOUNTER — Ambulatory Visit (INDEPENDENT_AMBULATORY_CARE_PROVIDER_SITE_OTHER): Payer: 59 | Admitting: Family Medicine

## 2020-07-09 ENCOUNTER — Ambulatory Visit: Payer: 59 | Admitting: Obstetrics and Gynecology

## 2020-07-09 VITALS — BP 123/80 | HR 76 | Ht 68.0 in | Wt 190.9 lb

## 2020-07-09 DIAGNOSIS — Z975 Presence of (intrauterine) contraceptive device: Secondary | ICD-10-CM

## 2020-07-09 DIAGNOSIS — Z30430 Encounter for insertion of intrauterine contraceptive device: Secondary | ICD-10-CM | POA: Diagnosis not present

## 2020-07-09 LAB — POCT URINALYSIS DIP (DEVICE)
Bilirubin Urine: NEGATIVE
Glucose, UA: NEGATIVE mg/dL
Hgb urine dipstick: NEGATIVE
Ketones, ur: NEGATIVE mg/dL
Leukocytes,Ua: NEGATIVE
Nitrite: NEGATIVE
Protein, ur: NEGATIVE mg/dL
Specific Gravity, Urine: >=1.03 (ref 1.005–1.030)
Urobilinogen, UA: 0.2 mg/dL (ref 0.0–1.0)
pH: 7 (ref 5.0–8.0)

## 2020-07-09 LAB — POCT PREGNANCY, URINE: Preg Test, Ur: NEGATIVE

## 2020-07-09 MED ORDER — LEVONORGESTREL 19.5 MCG/DAY IU IUD: INTRAUTERINE_SYSTEM | Freq: Once | INTRAUTERINE | Status: AC

## 2020-07-09 NOTE — Progress Notes (Signed)
    GYNECOLOGY OFFICE PROCEDURE NOTE  Amanda Kerr is a 30 y.o. G2P1011 here for [place] IUD insertion. No GYN concerns.  Last pap smear was on 05/09/2018 and was normal.  IUD Insertion Procedure Note Patient identified, informed consent performed, consent signed.   Discussed risks of irregular bleeding, infection, malpositioning or misplacement of the IUD outside the uterus which may require further procedure such as laparoscopy. Also discussed >99% contraception efficacy, increased risk of ectopic pregnancy with failure of method.  Time out was performed.  Urine pregnancy test negative.  Speculum placed in the vagina.  Cervix visualized.  Cleaned with Betadine x 2.  Grasped anteriorly with a single tooth tenaculum.  Uterus sounded to 8 cm.  Liletta IUD placed per manufacturer's recommendations.  Strings trimmed to 4 cm. Tenaculum was removed, good hemostasis noted.  Patient tolerated procedure well.   Patient was given post-procedure instructions.  She was advised to have backup contraception for one week.  Patient was also asked to check IUD strings periodically and follow up in 4 weeks for IUD check.   Venora Maples, MD/MPH Attending Family Medicine Physician, [facility] for [facility], [facility]

## 2020-07-09 NOTE — Patient Instructions (Signed)

## 2020-08-08 ENCOUNTER — Ambulatory Visit (INDEPENDENT_AMBULATORY_CARE_PROVIDER_SITE_OTHER): Payer: 59 | Admitting: Obstetrics and Gynecology

## 2020-08-08 ENCOUNTER — Encounter: Payer: Self-pay | Admitting: Obstetrics and Gynecology

## 2020-08-08 ENCOUNTER — Other Ambulatory Visit (HOSPITAL_COMMUNITY)
Admission: RE | Admit: 2020-08-08 | Discharge: 2020-08-08 | Disposition: A | Discharge status: Home / Self Care | Payer: 59 | Source: Ambulatory Visit | Attending: Obstetrics and Gynecology | Admitting: Obstetrics and Gynecology

## 2020-08-08 ENCOUNTER — Other Ambulatory Visit: Payer: Self-pay

## 2020-08-08 VITALS — BP 119/78 | HR 85 | Wt 198.8 lb

## 2020-08-08 DIAGNOSIS — Z975 Presence of (intrauterine) contraceptive device: Secondary | ICD-10-CM | POA: Insufficient documentation

## 2020-08-08 DIAGNOSIS — N921 Excessive and frequent menstruation with irregular cycle: Secondary | ICD-10-CM | POA: Diagnosis not present

## 2020-08-08 DIAGNOSIS — Z30431 Encounter for routine checking of intrauterine contraceptive device: Secondary | ICD-10-CM | POA: Diagnosis not present

## 2020-08-08 DIAGNOSIS — N898 Other specified noninflammatory disorders of vagina: Secondary | ICD-10-CM | POA: Insufficient documentation

## 2020-08-08 HISTORY — DX: Excessive and frequent menstruation with irregular cycle: Z97.5

## 2020-08-08 HISTORY — DX: Presence of (intrauterine) contraceptive device: N92.1

## 2020-08-08 NOTE — Patient Instructions (Signed)
Natural Remedies for Bacterial Vaginosis   Option #1  1 Tbsp Fractitionated Coconut Oil  10 drops of Melaleuca (Tea Tree) Oil   Mix ingredients together well.  Soak 3-4 tampons (in applicators) in that mixture until all or mostly all mixture is soaked up into the tampons.  Insert 1 saturated tampon vaginally and wear overnight for 3-4 nights.     Option #2 (sometimes to be used in conjunction with option #1)  Fill tub with enough to cover lap/lower abdomen warm water.  Mix 1/2 cup of baking soda in water.  Soak in water/baking soda mixture for at least 20 minutes.  Be sure to swish water in between legs to get as much in vagina as possible.  This soak should be done after sexual intercourse and menstrual cycles.     GO WHITE:  Soap: UNSCENTED Dove (white box light green writing)  Laundry detergent (underwear)- Dreft or [employer] n[employer] Hammer unscented  WHITE 100% cotton panties (NOT just cotton crouch)  Sanitary napkin/panty liners: UNSCENTED.  If it doesn't SAY unscented it can have a scent/perfume    NO PERFUMES OR LOTIONS OR POTIONS in the vulvar area (may use regular KY)  Condoms: hypoallergenic only. Non dyed (no color)  Toilet papers: white only  Wash clothes: use a separate wash cloth. WHITE.  Wash in Dreft.   You can purchase Tea Tree Oil locally at:   [employer]  [address]  [place] [address]   [employer]  [address]  [place]  [address]   Advise that these alternatives will not replace the need to be evaluated if symptoms persist. You will need to seek care at an OB/GYN provider.   

## 2020-08-08 NOTE — Progress Notes (Signed)
Obstetrics and Gynecology Visit Return Patient Evaluation  Appointment Date: 08/08/2020  Primary Care Provider: [facility]: [facility] for [facility]  Chief Complaint: regularly scheduled string check  History of Present Illness:  Amanda Kerr is a 30 y.o. P1 s/p Liletta IUD placement on 4/12 at her PP visit s/p SVD on 2/14. PMHx negative.  Interval History: Since that time, she states that she has had old blood/brown discharge spotting since placement. She's had sex since and no pain; partner does feel strings. She is not breastfeeding and she has not had a period since placement  Review of Systems: as noted in the History of Present Illness. Patient Active Problem List   Diagnosis Date Noted  . IUD (intrauterine device) in place 07/09/2020  . Vaginal delivery 05/14/2020  . Female pelvic inflammatory disease 10/25/2019  . Adjustment disorder 10/25/2019  . HSV (herpes simplex virus) 09/19/2018  . Anxiety and depression 11/09/2017  . History of asthma 11/09/2017  . Allodynia 11/09/2017   Medications:  [name] had no medications administered during this visit. Current Outpatient Medications  Medication Sig Dispense Refill  . levonorgestrel (LILETTA, 52 MG,) 20.1 MCG/DAY IUD 1 each by Intrauterine route once.     No current facility-administered medications for this visit.    Allergies: has No Known Allergies.  Physical Exam:  BP 119/78   Pulse 85   Wt 198 lb 12.8 oz (90.2 kg)   BMI 30.23 kg/m  Body mass index is 30.23 kg/m. General appearance: Well nourished, well developed female in no acute distress.  Abdomen: diffusely non tender to palpation, non distended, and no masses, hernias Neuro/Psych:  Normal mood and affect.    Pelvic exam:  EGBUS Vaginal vault: old blood in the vault, small amount, no active bleeding Cervix: normal; blue iud strings approximately 2-3cm. They almost wrap around the external  ols  Assessment: pt stable  Plan:  1. Vaginal discharge - Cervicovaginal ancillary only( [place])  2. IUD check up I told her to let us know if bleeding persists after a month; she has a little more than I would expect after an IUD placement and I told her that if it persists, she may need an u/s or a month or two of OCPs or maybe just d/c the IUD depending on what her s/s are like. Pt to mychart me and let me know.   I also told her to give the strings more time to soften up and that they seem long enough that they can wrap around the cervix once they soften up and hopefully make it so her partner doesn't feel them.   RTC: PRN  Cornelia Copa MD Attending Center for [facility] [facility])

## 2020-08-09 LAB — CERVICOVAGINAL ANCILLARY ONLY
Bacterial Vaginitis (gardnerella): POSITIVE — AB
Candida Glabrata: NEGATIVE
Candida Vaginitis: NEGATIVE
Chlamydia: NEGATIVE
Comment: NEGATIVE
Comment: NEGATIVE
Comment: NEGATIVE
Comment: NEGATIVE
Comment: NEGATIVE
Comment: NORMAL
Neisseria Gonorrhea: NEGATIVE
Trichomonas: NEGATIVE

## 2020-08-12 MED ORDER — METRONIDAZOLE 500 MG PO TABS: 500.0000 mg | ORAL_TABLET | Freq: Two times a day (BID) | ORAL | 0 refills | Status: AC

## 2020-08-12 NOTE — Addendum Note (Signed)
Addended by: Devers Bing on: 08/12/2020 02:42 PM   Modules accepted: Orders

## 2021-02-05 ENCOUNTER — Telehealth: Payer: Self-pay

## 2021-02-05 NOTE — Telephone Encounter (Signed)
[facility] called and asked if Dr. Salomon Fick could be uploaded showing that Dr. Salomon Fick is patients PCP

## 2021-02-06 NOTE — Telephone Encounter (Signed)
Noted  

## 2021-03-27 ENCOUNTER — Telehealth (INDEPENDENT_AMBULATORY_CARE_PROVIDER_SITE_OTHER): Payer: 59 | Admitting: Physician Assistant

## 2021-03-27 DIAGNOSIS — N760 Acute vaginitis: Secondary | ICD-10-CM | POA: Diagnosis not present

## 2021-03-27 DIAGNOSIS — B9689 Other specified bacterial agents as the cause of diseases classified elsewhere: Secondary | ICD-10-CM | POA: Diagnosis not present

## 2021-03-27 MED ORDER — METRONIDAZOLE 500 MG PO TABS: 500.0000 mg | ORAL_TABLET | Freq: Two times a day (BID) | ORAL | 0 refills | Status: DC

## 2021-03-27 NOTE — Progress Notes (Signed)

## 2021-03-27 NOTE — Progress Notes (Signed)
I have spent 5 minutes in review of e-visit questionnaire, review and updating patient chart, medical decision making and response to patient.   Amanda Kerr Cody Honesti Seaberg, PA-C    

## 2021-09-24 ENCOUNTER — Encounter: Payer: Self-pay | Admitting: Family Medicine

## 2021-09-24 ENCOUNTER — Ambulatory Visit (INDEPENDENT_AMBULATORY_CARE_PROVIDER_SITE_OTHER): Payer: 59 | Admitting: Family Medicine

## 2021-09-24 VITALS — BP 120/88 | HR 91 | Temp 98.6°F | Ht 66.5 in | Wt 204.2 lb

## 2021-09-24 DIAGNOSIS — R0789 Other chest pain: Secondary | ICD-10-CM

## 2021-09-24 DIAGNOSIS — Z0000 Encounter for general adult medical examination without abnormal findings: Secondary | ICD-10-CM | POA: Diagnosis not present

## 2021-09-24 DIAGNOSIS — R202 Paresthesia of skin: Secondary | ICD-10-CM | POA: Diagnosis not present

## 2021-09-24 DIAGNOSIS — F419 Anxiety disorder, unspecified: Secondary | ICD-10-CM

## 2021-09-24 LAB — CBC WITH DIFFERENTIAL/PLATELET
Basophils Absolute: 0 10*3/uL (ref 0.0–0.1)
Basophils Relative: 0.5 % (ref 0.0–3.0)
Eosinophils Absolute: 0 10*3/uL (ref 0.0–0.7)
Eosinophils Relative: 0.6 % (ref 0.0–5.0)
HCT: 39.6 % (ref 36.0–46.0)
Hemoglobin: 13.1 g/dL (ref 12.0–15.0)
Lymphocytes Relative: 54.2 % — ABNORMAL HIGH (ref 12.0–46.0)
Lymphs Abs: 2.7 10*3/uL (ref 0.7–4.0)
MCHC: 33.1 g/dL (ref 30.0–36.0)
MCV: 83.4 fl (ref 78.0–100.0)
Monocytes Absolute: 0.3 10*3/uL (ref 0.1–1.0)
Monocytes Relative: 6.7 % (ref 3.0–12.0)
Neutro Abs: 1.9 10*3/uL (ref 1.4–7.7)
Neutrophils Relative %: 38 % — ABNORMAL LOW (ref 43.0–77.0)
Platelets: 316 10*3/uL (ref 150.0–400.0)
RBC: 4.75 Mil/uL (ref 3.87–5.11)
RDW: 14.6 % (ref 11.5–15.5)
WBC: 4.9 10*3/uL (ref 4.0–10.5)

## 2021-09-24 LAB — BASIC METABOLIC PANEL
BUN: 11 mg/dL (ref 6–23)
CO2: 29 mEq/L (ref 19–32)
Calcium: 9.4 mg/dL (ref 8.4–10.5)
Chloride: 104 mEq/L (ref 96–112)
Creatinine, Ser: 0.78 mg/dL (ref 0.40–1.20)
GFR: 100.89 mL/min (ref >60.00)
Glucose, Bld: 86 mg/dL (ref 70–99)
Potassium: 4.2 mEq/L (ref 3.5–5.1)
Sodium: 138 mEq/L (ref 135–145)

## 2021-09-24 LAB — T4, FREE: Free T4: 1 ng/dL (ref 0.60–1.60)

## 2021-09-24 LAB — LIPID PANEL
Cholesterol: 177 mg/dL (ref 0–200)
HDL: 39.1 mg/dL (ref >39.00)
LDL Cholesterol: 122 mg/dL — ABNORMAL HIGH (ref 0–99)
NonHDL: 137.73
Total CHOL/HDL Ratio: 5
Triglycerides: 81 mg/dL (ref 0.0–149.0)
VLDL: 16.2 mg/dL (ref 0.0–40.0)

## 2021-09-24 LAB — HEMOGLOBIN A1C: Hgb A1c MFr Bld: 5.8 % (ref 4.6–6.5)

## 2021-09-24 LAB — TSH: TSH: 1.17 u[IU]/mL (ref 0.35–5.50)

## 2021-09-24 LAB — VITAMIN B12: Vitamin B-12: 266 pg/mL (ref 211–911)

## 2021-09-24 NOTE — Progress Notes (Addendum)
Subjective:     Amanda Kerr is a 31 y.o. female and is here for a comprehensive physical exam. The patient reports dong well overall.  Pt notes intermittent chest pressure.  May last a few seconds.  Can occur when patient is sitting.  In the past told 2/2 anxiety.  Patient denies drinking caffeine, poor sleep.  Patient has some stress.  Seeing a therapist.  Has tried to decrease sugar.  Endorses increased sodium intake but is working on it.  Patient notes of numbness/tingling in left foot that traveled up left leg into abdomen.  Sensation occurred after prolonged sitting.  Patient denies wearing tight clothes, shoes that press on foot.  Currently on menses.  Patient has a 32-year-old son.  Social History   Socioeconomic History   Marital status: Single    Spouse name: Not on file   Number of children: Not on file   Years of education: Not on file   Highest education level: Not on file  Occupational History   Not on file  Tobacco Use   Smoking status: Never   Smokeless tobacco: Never  Vaping Use   Vaping Use: Never used  Substance and Sexual Activity   Alcohol use: Not Currently    Comment: occasional   Drug use: No   Sexual activity: Yes    Birth control/protection: None    Comment: IUD plans at first  post partum visit  Other Topics Concern   Not on file  Social History Narrative   Not on file   Social Determinants of Health   Financial Resource Strain: Not on file  Food Insecurity: No Food Insecurity (08/08/2020)   Hunger Vital Sign    Worried About Running Out of Food in the Last Year: Never true    Ran Out of Food in the Last Year: Never true  Transportation Needs: No Transportation Needs (08/08/2020)   PRAPARE - Administrator, Civil Service (Medical): No    Lack of Transportation (Non-Medical): No  Physical Activity: Not on file  Stress: Not on file  Social Connections: Not on file  Intimate Partner Violence: Not on file   Health Maintenance   Topic Date Due   PAP SMEAR-Modifier  05/09/2021   COVID-19 Vaccine (3 - Pfizer series) 10/10/2021 (Originally 01/27/2020)   INFLUENZA VACCINE  10/28/2021   TETANUS/TDAP  02/28/2030   Hepatitis C Screening  Completed   HIV Screening  Completed   HPV VACCINES  Aged Out    The following portions of the patient's history were reviewed and updated as appropriate: allergies, current medications, past family history, past medical history, past social history, past surgical history, and problem list.  Review of Systems Pertinent items noted in HPI and remainder of comprehensive ROS otherwise negative.   Objective:    BP 120/88 (BP Location: Right Arm, Cuff Size: Normal)   Pulse 91   Temp 98.6 F (37 C) (Oral)   Ht 5' 6.5" (1.689 m)   Wt 204 lb 3.2 oz (92.6 kg)   LMP 09/21/2021 (Exact Date)   SpO2 99%   BMI 32.46 kg/m  General appearance: alert, cooperative, and no distress Head: Normocephalic, without obvious abnormality, atraumatic Eyes: conjunctivae/corneas clear. PERRL, EOM's intact. Fundi benign. Ears: normal TM's and external ear canals both ears Nose: Nares normal. Septum midline. Mucosa normal. No drainage or sinus tenderness. Throat: lips, mucosa, and tongue normal; teeth and gums normal Neck: no adenopathy, no carotid bruit, no JVD, supple, symmetrical, trachea midline, and  thyroid not enlarged, symmetric, no tenderness/mass/nodules Lungs: clear to auscultation bilaterally Heart: regular rate and rhythm, S1, S2 normal, no murmur, click, rub or gallop Abdomen: soft, non-tender; bowel sounds normal; no masses,  no organomegaly Extremities: extremities normal, atraumatic, no cyanosis or edema Pulses: 2+ and symmetric Skin: Skin color, texture, turgor normal. No rashes or lesions Lymph nodes: Cervical, supraclavicular, and axillary nodes normal. Neurologic: Alert and oriented X 3, normal strength and tone. Normal symmetric reflexes. Normal coordination and gait     Assessment:    Healthy female exam.      Plan:    Anticipatory guidance given including wearing seatbelts, smoke detectors in the home, increasing physical activity, increasing p.o. intake of water and vegetables. -labs -pap due.  Pt wishes to wait as on menses -Mammogram and colonoscopy not yet indicated 2/2 age -Immunizations reviewed -Given handout -Next CPE in 1 year See After Visit Summary for Counseling Recommendations   Paresthesia  - Plan: Vitamin B12, CBC with Differential/Platelet, Basic metabolic panel, TSH, T4, Free, Hemoglobin A1c  Chest pressure -Discussed various causes including anxiety, thyroid dysfunction, medications, electrolyte abnormality, arrhythmia -We will obtain labs -For continued or worsening symptoms obtain Holter monitor -Discussed ways to decrease anxiety - Plan: CBC with Differential/Platelet, Basic metabolic panel, TSH, T4, Free, Lipid panel  Anxiety  -Continue counseling -Consider medication options for worsening symptoms -Continue self-care - Plan: TSH, T4, Free   Follow-up as needed  Abbe Amsterdam, MD

## 2021-09-24 NOTE — Patient Instructions (Addendum)
Per chart review it appears that I did your last Pap on 05/09/2018.  And seeing some of the OB records they list 2/14 2021 as her last Pap however I think this is a typographical error.   So technically her Pap would be due this year.  We can set this up at your convenience.

## 2021-10-28 ENCOUNTER — Encounter: Payer: Self-pay | Admitting: Family Medicine

## 2021-11-04 MED ORDER — VALACYCLOVIR HCL 500 MG PO TABS: ORAL_TABLET | ORAL | 0 refills | Status: DC

## 2021-11-18 ENCOUNTER — Other Ambulatory Visit: Payer: Self-pay | Admitting: Family Medicine

## 2022-01-13 IMAGING — US US MFM OB DETAIL+14 WK
1 series · 13 of 28 positions shown · non-contrast
Comparison: none

[Series 1: us mfm ob detail+14 wk · 13 of 152 slices shown]
[im 6/152]
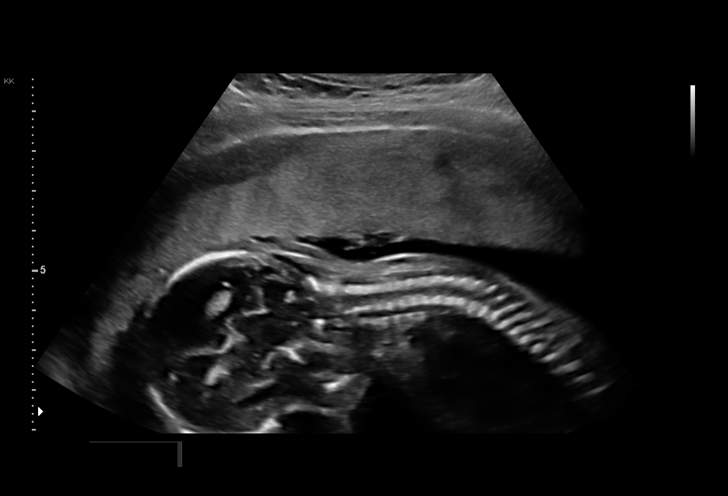
[im 17/152]
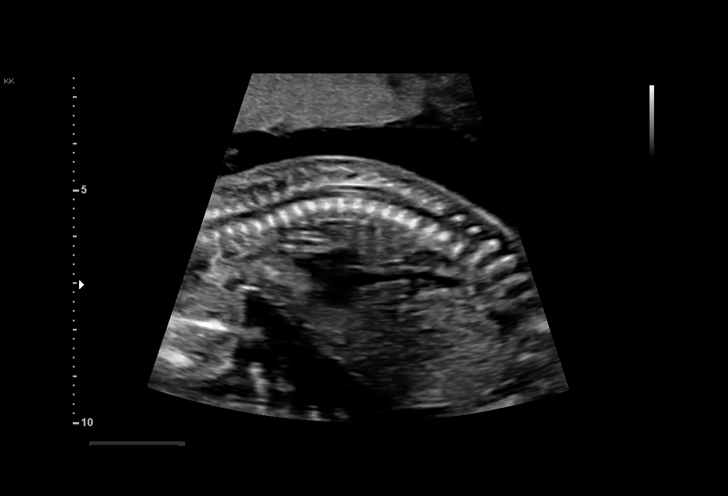
[im 28/152]
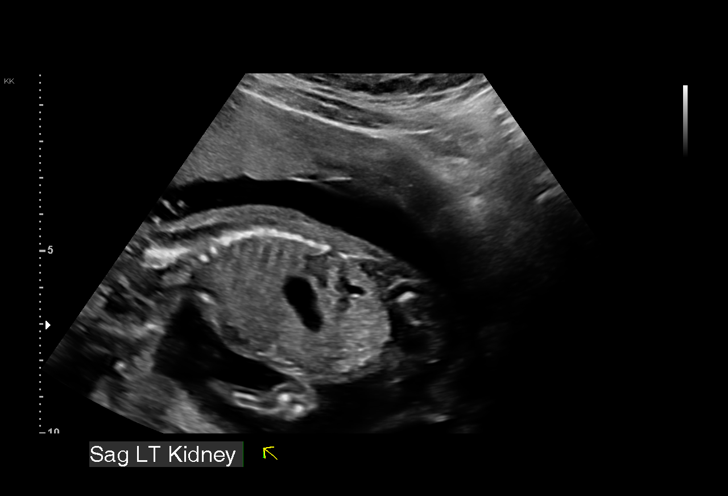
[im 40/152]
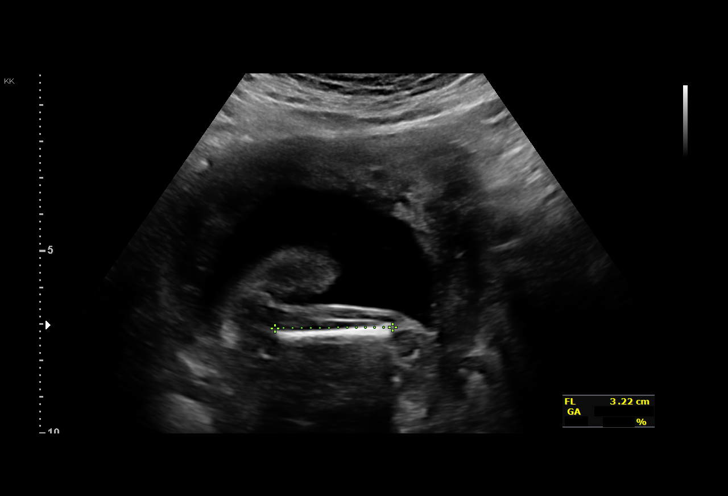
[im 51/152]
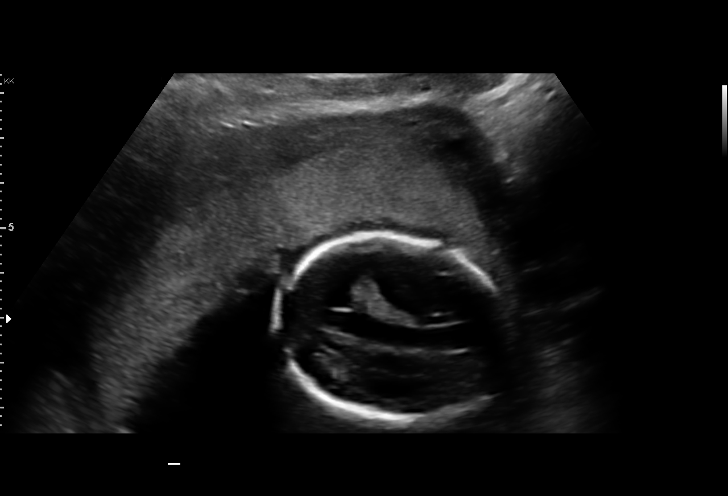
[im 62/152]
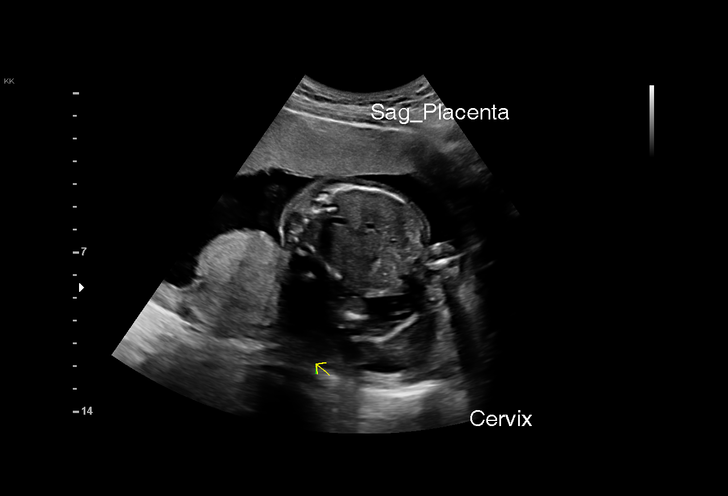
[im 79/152]
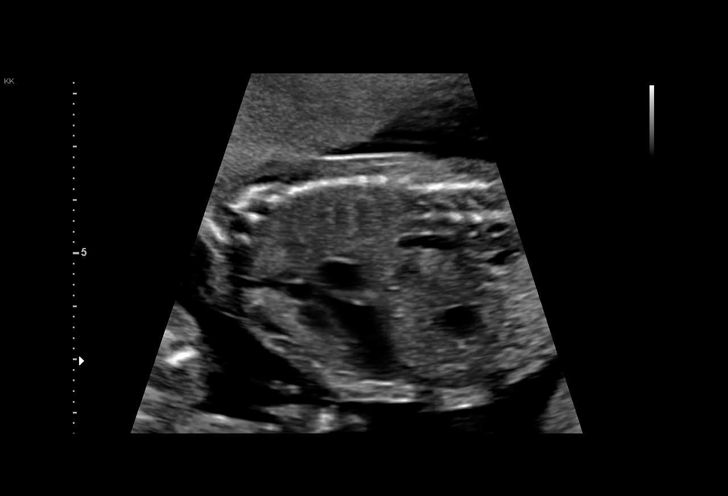
[im 90/152]
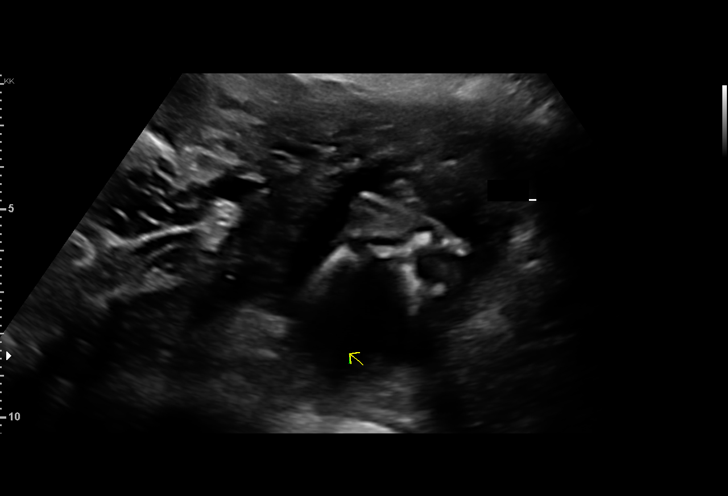
[im 101/152]
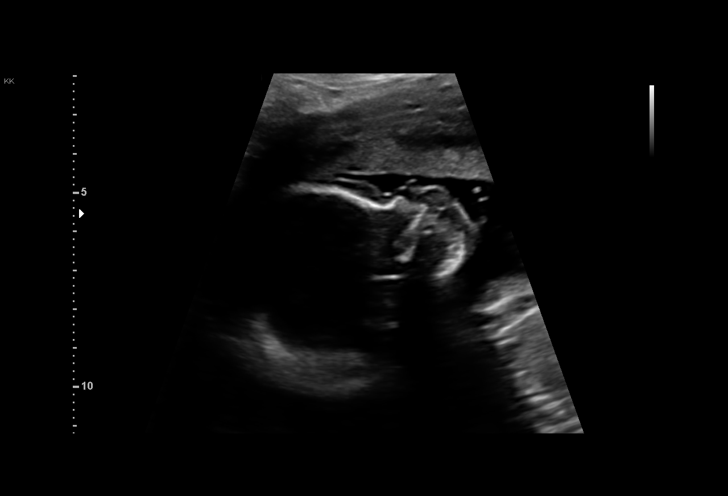
[im 112/152]
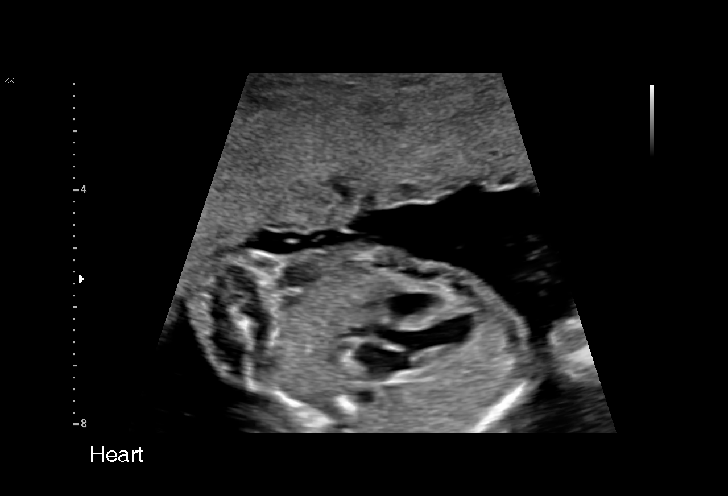
[im 124/152]
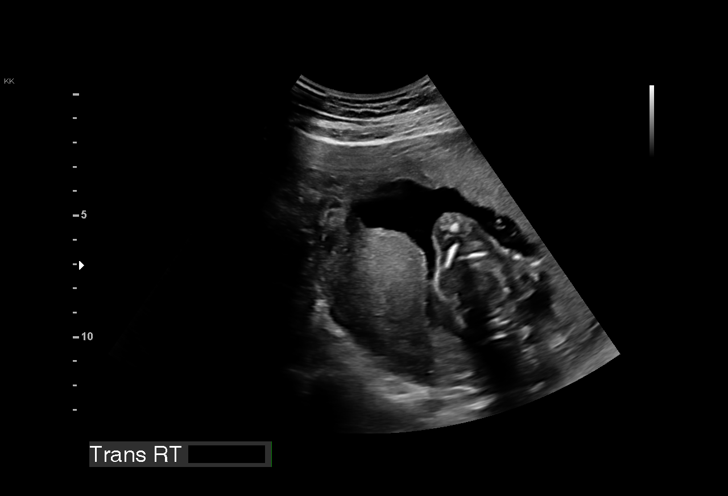
[im 135/152]
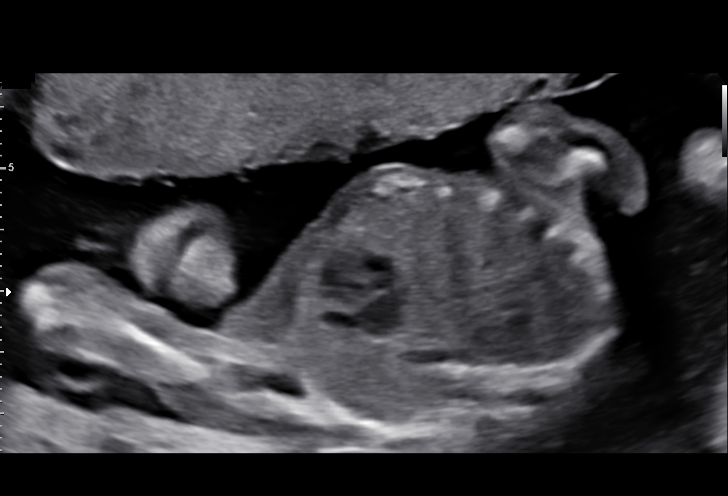
[im 146/152]
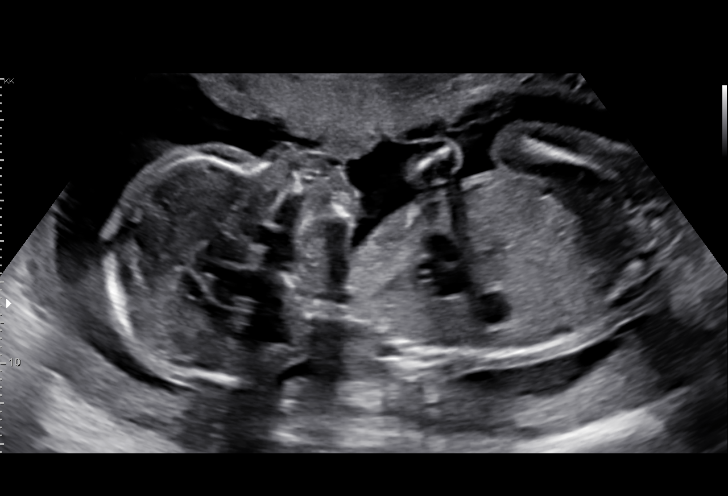

[13 of 28 positions shown; findings below may reference images not displayed]

Indications

 Asthma                                         EAA.XA [id number]
 Encounter for antenatal screening for
 malformations
 Genetic carrier (Silent Carrier for Alpha
 Thalassemia)
 Low Risk NIPS
 19 weeks gestation of pregnancy
Fetal Evaluation

 Num Of Fetuses:         1
 Fetal Heart Rate(bpm):  161
 Cardiac Activity:       Observed
 Presentation:           Breech
 Placenta:               Anterior
 P. Cord Insertion:      Visualized

 Amniotic Fluid
 AFI FV:      Within normal limits

                             Largest Pocket(cm)

Biometry

 BPD:      41.7  mm     G. Age:  18w 4d         23  %    CI:        64.87   %    70 - 86
                                                         FL/HC:      19.2   %    16.1 -
 HC:      166.4  mm     G. Age:  19w 2d         45  %    HC/AC:      1.05        1.09 -
 AC:      158.7  mm     G. Age:  21w 0d         91  %    FL/BPD:     76.7   %
 FL:         32  mm     G. Age:  20w 0d         68  %    FL/AC:      20.2   %    20 - 24
 HUM:      28.6  mm     G. Age:  19w 2d         50  %
 NFT:       4.8  mm

 Est. FW:     347  gm    0 lb 12 oz      95  %
OB History

 Gravidity:    2         Term:   0        Prem:   0        SAB:   1
 TOP:          0       Ectopic:  0        Living: 0
Gestational Age

 LMP:           19w 2d        Date:  08/12/19                 EDD:   05/18/20
 U/S Today:     19w 5d                                        EDD:   05/15/20
 Best:          19w 2d     Det. By:  LMP  (08/12/19)          EDD:   05/18/20
Anatomy

 Cranium:               Appears normal         Aortic Arch:            Appears normal
 Cavum:                 Appears normal         Ductal Arch:            Appears normal
 Ventricles:            Appears normal         Diaphragm:              Appears normal
 Choroid Plexus:        Appears normal         Stomach:                Appears normal, left
                                                                       sided
 Cerebellum:            Appears normal         Abdomen:                Appears normal
 Posterior Fossa:       Appears normal         Abdominal Wall:         Appears nml (cord
                                                                       insert, abd wall)
 Nuchal Fold:           Appears normal         Cord Vessels:           Appears normal (3
                                                                       vessel cord)
 Face:                  Appears normal         Kidneys:                Appear normal
                        (orbits and profile)
 Lips:                  Appears normal         Bladder:                Appears normal
 Thoracic:              Appears normal         Spine:                  Appears normal
 Heart:                 Not well visualized    Upper Extremities:      Appears normal
 RVOT:                  Not well visualized    Lower Extremities:      Appears normal
 LVOT:                  Not well visualized

 Other:  Fetus a male. Hands and feet not well visualized. Technically difficult
         due to fetal position.
Cervix Uterus Adnexa

 Cervix
 Length:            3.2  cm.
 Normal appearance by transabdominal scan.

 Adnexa
 No abnormality visualized.
Comments

 This patient was seen for a detailed fetal anatomy scan.
 She denies any significant past medical history and denies
 any problems in her current pregnancy.
 She had a cell free DNA test earlier in her pregnancy which
 indicated a low risk for trisomy 21, 18, and 13. A male fetus is
 predicted.
 She was informed that the fetal growth and amniotic fluid
 level were appropriate for her gestational age.
 There were no obvious fetal anomalies noted on today's
 ultrasound exam.  However, the views of the fetal anatomy
 were limited today due to the fetal position.
 The patient was informed that anomalies may be missed due
 to technical limitations. If the fetus is in a suboptimal position
 or maternal habitus is increased, visualization of the fetus in
 the maternal uterus may be impaired.
 A follow-up exam was scheduled in 4 weeks to complete the
 views of the fetal anatomy.

## 2022-02-24 ENCOUNTER — Other Ambulatory Visit: Payer: Self-pay | Admitting: Family Medicine

## 2022-02-25 MED ORDER — VALACYCLOVIR HCL 500 MG PO TABS: ORAL_TABLET | ORAL | 5 refills | Status: AC

## 2022-04-13 ENCOUNTER — Ambulatory Visit: Payer: 59 | Admitting: Family Medicine

## 2022-05-05 ENCOUNTER — Telehealth: Payer: 59 | Admitting: Physician Assistant

## 2022-05-05 DIAGNOSIS — B9689 Other specified bacterial agents as the cause of diseases classified elsewhere: Secondary | ICD-10-CM

## 2022-05-05 DIAGNOSIS — N760 Acute vaginitis: Secondary | ICD-10-CM

## 2022-05-05 MED ORDER — METRONIDAZOLE 500 MG PO TABS: 500.0000 mg | ORAL_TABLET | Freq: Two times a day (BID) | ORAL | 0 refills | Status: DC

## 2022-05-05 NOTE — Progress Notes (Signed)

## 2022-05-05 NOTE — Progress Notes (Signed)
I have spent 5 minutes in review of e-visit questionnaire, review and updating patient chart, medical decision making and response to patient.   Sona Nations Cody Jarae Nemmers, PA-C    

## 2022-12-29 ENCOUNTER — Telehealth: Payer: Self-pay

## 2022-12-29 NOTE — Progress Notes (Signed)
New Patient Office Visit  Subjective    Patient ID: Amanda Kerr, female    DOB: Aug 19, 1990  Age: 32 y.o. MRN: [id number]  CC:  Chief Complaint  Patient presents with   Establish Care    Pt is here today to Est.Care Pt reports she will like to have labs done. Pt has concerns of Odor and discharge for few months. Pt reports this happens often. Pt reports she is not FASTING     HPI [name] presents to establish care with new primary care.   Patients previous primary care provider: [facility] [place], Dr. Abbe Amsterdam. Last seen: 09/24/2021. Change in location. She felt uncomfortable at that location, unsure if it was staff or provider.   Specialist: [facility] [employer] [facility], Dr. Hillcrest Bing. Last visit was 08/08/2020.   Patient is having a vaginal discharge and odor. She reports this started a few months ago. In the past few months, it has been intermittent. Describes vaginal discharge as white, varies between thick and thin. Sexually active with one partner. Not using protection. Reports intermittent mild abd cramping. Sometimes she feels nausea with no vomiting. Denies fever, diarrhea, constipation, or urinary symptoms.   Genital herpes: Chronic. Patient is taking Valtrex 500mg  daily as needed. Last outbreak was possibly the beginning of this year.   Patient is complaining of chest pain. Initially chest pain started a couple of years ago. She reports pain is random spots, intermittent. Pain is described as light sharp pain. Denies radiating pain. Usually last a few seconds. Occurs usually every other day. Denies SHOB, dizziness, lightheadedness, nausea, or vomiting. Patient reports she is sitting, resting when pain starts. She denies identifying anything that makes it better or worse. Denies taking any medication for her pain.   Outpatient Encounter Medications as of 01/01/2023  Medication Sig    levonorgestrel (LILETTA, 52 MG,) 20.1 MCG/DAY IUD IUD 1 each by Intrauterine route once.   Probiotic Product (PROBIOTIC DAILY PO) Take by mouth as needed.   valACYclovir (VALTREX) 500 MG tablet Take 1 tablet (500 mg total) by mouth daily as needed (outbreak).   [DISCONTINUED] levonorgestrel (LILETTA, 52 MG,) 20.1 MCG/DAY IUD 1 each by Intrauterine route once. (Patient not taking: Reported on 01/01/2023)   [DISCONTINUED] metroNIDAZOLE (FLAGYL) 500 MG tablet Take 1 tablet (500 mg total) by mouth 2 (two) times daily. (Patient not taking: Reported on 01/01/2023)   No facility-administered encounter medications on file as of 01/01/2023.    Past Medical History:  Diagnosis Date   Asthma    Bacterial vaginitis    Depression    Female pelvic inflammatory disease 10/25/2019   Frequent headaches    Herpes genitalis in women    History of asthma 11/09/2017   Childhood    HSV (herpes simplex virus) 09/19/2018   PPX at 36 weeks   UTI (urinary tract infection)     Past Surgical History:  Procedure Laterality Date   NO PAST SURGERIES      Family History  Problem Relation Age of Onset   Hypertension Mother    Hypertension Father    Hyperlipidemia Father    Asthma Sister    Diabetes Maternal Grandmother    Glaucoma Maternal Grandmother    Heart disease Maternal Grandmother    Heart disease Paternal Grandmother    Cancer Paternal Grandfather     Social History   Socioeconomic History   Marital status: Single    Spouse  name: Not on file   Number of children: 1   Years of education: Not on file   Highest education level: Bachelor's degree (e.g., BA, AB, BS)  Occupational History   Not on file  Tobacco Use   Smoking status: Never   Smokeless tobacco: Never  Vaping Use   Vaping status: Never Used  Substance and Sexual Activity   Alcohol use: Yes    Comment: Once a month   Drug use: No   Sexual activity: Yes    Birth control/protection: None    Comment: IUD plans at first  post  partum visit  Other Topics Concern   Not on file  Social History Narrative   Not on file   Social Determinants of Health   Financial Resource Strain: Medium Risk (12/31/2022)   Overall Financial Resource Strain (CARDIA)    Difficulty of Paying Living Expenses: Somewhat hard  Food Insecurity: No Food Insecurity (12/31/2022)   Hunger Vital Sign    Worried About Running Out of Food in the Last Year: Never true    Ran Out of Food in the Last Year: Never true  Transportation Needs: No Transportation Needs (12/31/2022)   PRAPARE - Administrator, Civil Service (Medical): No    Lack of Transportation (Non-Medical): No  Physical Activity: Inactive (01/01/2023)   Exercise Vital Sign    Days of Exercise per Week: 0 days    Minutes of Exercise per Session: 0 min  Stress: Stress Concern Present (12/31/2022)   [employer] of Occupational Health - Occupational Stress Questionnaire    Feeling of Stress : Rather much  Social Connections: Moderately Isolated (12/31/2022)   Social Connection and Isolation Panel [NHANES]    Frequency of Communication with Friends and Family: Three times a week    Frequency of Social Gatherings with Friends and Family: Three times a week    Attends Religious Services: Never    Active Member of Clubs or Organizations: No    Attends Banker Meetings: Not on file    Marital Status: Living with partner  Intimate Partner Violence: Not At Risk (01/01/2023)   Humiliation, Afraid, Rape, and Kick questionnaire    Fear of Current or Ex-Partner: No    Emotionally Abused: No    Physically Abused: No    Sexually Abused: No   ROS See HPI above    Objective   BP 108/64   Pulse 74   Temp 98.7 F (37.1 C)   Ht 5\' 8"  (1.727 m)   Wt 198 lb 2 oz (89.9 kg)   SpO2 99%   BMI 30.12 kg/m   Physical Exam Vitals reviewed.  Constitutional:      General: She is not in acute distress.    Appearance: Normal appearance. She is obese. She is not  ill-appearing, toxic-appearing or diaphoretic.  HENT:     Head: Normocephalic and atraumatic.  Eyes:     General:        Right eye: No discharge.        Left eye: No discharge.     Conjunctiva/sclera: Conjunctivae normal.  Cardiovascular:     Rate and Rhythm: Normal rate and regular rhythm.     Heart sounds: Normal heart sounds. No murmur heard.    No friction rub. No gallop.  Pulmonary:     Effort: Pulmonary effort is normal. No respiratory distress.     Breath sounds: Normal breath sounds.  Abdominal:     General: There is no  distension.     Palpations: Abdomen is soft.     Tenderness: There is no abdominal tenderness. There is no right CVA tenderness or left CVA tenderness.  Musculoskeletal:        General: Normal range of motion.     Right lower leg: No edema.     Left lower leg: No edema.  Skin:    General: Skin is warm and dry.  Neurological:     General: No focal deficit present.     Mental Status: She is alert and oriented to person, place, and time. Mental status is at baseline.  Psychiatric:        Mood and Affect: Mood normal.        Behavior: Behavior normal.        Thought Content: Thought content normal.        Judgment: Judgment normal.   EKG completed due to chest pain. EKG NSR with no ST elevation. No available comparison.    Assessment & Plan:  Chest pain, unspecified type -     EKG 12-Lead -     Comprehensive metabolic panel -     TSH -     Magnesium -     CBC with Differential/Platelet  Vaginal discharge -     NuSwab Vaginitis Plus (VG+)  Vaginal odor -     NuSwab Vaginitis Plus (VG+)  Obesity (BMI 30-39.9) -     Comprehensive metabolic panel -     Lipid panel -     TSH -     CBC with Differential/Platelet  Cervical cancer screening -     Ambulatory referral to Obstetrics / Gynecology  Encounter to establish care   1.Review health maintenance:  -Pap smear: Placed a referral to GYN  -Covid vaccine/booster: Declines booster; initial  two vaccine  -Influenza vaccine: Declines, will wait later in season  2.EKG completed for chest pain. No concerns with EKG.  3.Ordered labs for reason of chest pain and screening labs (CBC with Diff, CMP, TSH, Lipid panel, and Magnesium) Office will call with lab results and she will see results on MyChart.  4. Patient self swabbed vaginal area for lab to assess reason for vaginal discharge and odor.  5.Continue taking Valacyclovir as needed for genital herpes.  6. Follow up for reason of chest pain.  Return in about 2 weeks (around 01/15/2023) for follow-up: [place] .   Zandra Abts, NP

## 2022-12-29 NOTE — Telephone Encounter (Signed)
Sent to [employer].

## 2023-01-01 ENCOUNTER — Ambulatory Visit (INDEPENDENT_AMBULATORY_CARE_PROVIDER_SITE_OTHER): Payer: 59 | Admitting: Family Medicine

## 2023-01-01 ENCOUNTER — Ambulatory Visit: Payer: 59 | Admitting: Family Medicine

## 2023-01-01 ENCOUNTER — Encounter: Payer: Self-pay | Admitting: Family Medicine

## 2023-01-01 VITALS — BP 108/64 | HR 74 | Temp 98.7°F | Ht 68.0 in | Wt 198.1 lb

## 2023-01-01 DIAGNOSIS — R079 Chest pain, unspecified: Secondary | ICD-10-CM

## 2023-01-01 DIAGNOSIS — E669 Obesity, unspecified: Secondary | ICD-10-CM | POA: Diagnosis not present

## 2023-01-01 DIAGNOSIS — Z8619 Personal history of other infectious and parasitic diseases: Secondary | ICD-10-CM

## 2023-01-01 DIAGNOSIS — B9689 Other specified bacterial agents as the cause of diseases classified elsewhere: Secondary | ICD-10-CM

## 2023-01-01 DIAGNOSIS — Z124 Encounter for screening for malignant neoplasm of cervix: Secondary | ICD-10-CM | POA: Diagnosis not present

## 2023-01-01 DIAGNOSIS — Z7689 Persons encountering health services in other specified circumstances: Secondary | ICD-10-CM

## 2023-01-01 DIAGNOSIS — N898 Other specified noninflammatory disorders of vagina: Secondary | ICD-10-CM

## 2023-01-01 DIAGNOSIS — N760 Acute vaginitis: Secondary | ICD-10-CM

## 2023-01-01 NOTE — Patient Instructions (Addendum)
-  It was a pleasure to meet you and look forward to taking care of you.  -EKG was completed due to chest pain. No concerns based reading. -Ordered labs for chest pain. Office will call with lab results and you will see results on MyChart.  -Ordered lab for possible reason for vaginal discharge and odor. Office will call with lab results and you will see them on MyChart.  -Placed a referral to GYN to re-establish care for pap smear.  -Follow up in 2 weeks for chest pain.

## 2023-01-02 LAB — CBC WITH DIFFERENTIAL/PLATELET
Absolute Monocytes: 390 {cells}/uL (ref 200–950)
Basophils Absolute: 30 {cells}/uL (ref 0–200)
Basophils Relative: 0.6 %
Eosinophils Absolute: 30 {cells}/uL (ref 15–500)
Eosinophils Relative: 0.6 %
HCT: 40.5 % (ref 35.0–45.0)
Hemoglobin: 13.3 g/dL (ref 11.7–15.5)
Lymphs Abs: 2305 {cells}/uL (ref 850–3900)
MCH: 27.8 pg (ref 27.0–33.0)
MCHC: 32.8 g/dL (ref 32.0–36.0)
MCV: 84.7 fL (ref 80.0–100.0)
MPV: 10.8 fL (ref 7.5–12.5)
Monocytes Relative: 7.8 %
Neutro Abs: 2245 {cells}/uL (ref 1500–7800)
Neutrophils Relative %: 44.9 %
Platelets: 315 10*3/uL (ref 140–400)
RBC: 4.78 10*6/uL (ref 3.80–5.10)
RDW: 13.3 % (ref 11.0–15.0)
Total Lymphocyte: 46.1 %
WBC: 5 10*3/uL (ref 3.8–10.8)

## 2023-01-02 LAB — COMPREHENSIVE METABOLIC PANEL
AG Ratio: 1.6 (calc) (ref 1.0–2.5)
ALT: 6 U/L (ref 6–29)
AST: 11 U/L (ref 10–30)
Albumin: 4.5 g/dL (ref 3.6–5.1)
Alkaline phosphatase (APISO): 46 U/L (ref 31–125)
BUN: 9 mg/dL (ref 7–25)
CO2: 21 mmol/L (ref 20–32)
Calcium: 9.5 mg/dL (ref 8.6–10.2)
Chloride: 106 mmol/L (ref 98–110)
Creat: 0.71 mg/dL (ref 0.50–0.97)
Globulin: 2.8 g/dL (ref 1.9–3.7)
Glucose, Bld: 75 mg/dL (ref 65–99)
Potassium: 4.6 mmol/L (ref 3.5–5.3)
Sodium: 141 mmol/L (ref 135–146)
Total Bilirubin: 0.3 mg/dL (ref 0.2–1.2)
Total Protein: 7.3 g/dL (ref 6.1–8.1)

## 2023-01-02 LAB — TSH: TSH: 1.44 m[IU]/L

## 2023-01-02 LAB — LIPID PANEL
Cholesterol: 169 mg/dL (ref <200)
HDL: 46 mg/dL — ABNORMAL LOW (ref >=50)
LDL Cholesterol (Calc): 107 mg/dL — ABNORMAL HIGH
Non-HDL Cholesterol (Calc): 123 mg/dL (ref <130)
Total CHOL/HDL Ratio: 3.7 (calc) (ref <5.0)
Triglycerides: 70 mg/dL (ref <150)

## 2023-01-02 LAB — MAGNESIUM: Magnesium: 2.1 mg/dL (ref 1.5–2.5)

## 2023-01-05 ENCOUNTER — Encounter: Payer: Self-pay | Admitting: Family Medicine

## 2023-01-06 LAB — NUSWAB VAGINITIS PLUS (VG+)
Atopobium vaginae: HIGH {score} — AB
BVAB 2: HIGH {score} — AB
Candida albicans, NAA: NEGATIVE
Candida glabrata, NAA: NEGATIVE
Megasphaera 1: HIGH {score} — AB

## 2023-01-06 MED ORDER — METRONIDAZOLE 500 MG PO TABS: 500.0000 mg | ORAL_TABLET | Freq: Two times a day (BID) | ORAL | 0 refills | Status: DC

## 2023-01-06 NOTE — Addendum Note (Signed)
Addended by: Elwin Mocha on: 01/06/2023 07:51 AM   Modules accepted: Orders

## 2023-01-15 ENCOUNTER — Ambulatory Visit: Payer: 59 | Admitting: Family Medicine

## 2023-01-15 VITALS — BP 130/84 | HR 89 | Temp 98.2°F | Wt 198.6 lb

## 2023-01-15 DIAGNOSIS — R079 Chest pain, unspecified: Secondary | ICD-10-CM

## 2023-01-15 NOTE — Progress Notes (Signed)
Established Patient Office Visit   Subjective:  Patient ID: Amanda Kerr, female    DOB: 18-May-1990  Age: 32 y.o. MRN: [id number]  Chief Complaint  Patient presents with   Medical Management of Chronic Issues    Chest pain comes and goes, feels it 4-5 days, sometimes just feels the pain no matter what she is doing. Can be standing, sitting, or lying down.     HPI Patient is following up with complaints of chest pain. Initially chest pain started a couple of years ago. She reports pain is random spots, intermittent. Pain is described as light sharp pain. Denies radiating pain. Usually last a few seconds. Occurs usually every other day. Denies SHOB, dizziness, lightheadedness, nausea, or vomiting. Patient reports she is sitting, resting when pain starts. She denies identifying anything that makes it better or worse. Denies taking any medication for her pain. EKG was completed with no concerns. Labs were completed for a reason of chest pain. Only abnormal findings were HDL was below goal of 46, and bad cholesterol was slightly elevated at 107.   She reports the chest pain is about the same. She reports the pain only happens a few seconds, happens when lying down, standing, or sitting. She reports it is a random pain in different areas of the chest. Pain described as sharp pain and immediately goes away. She reports sometimes she feels a "flutter" at the base of her neck into mid upper sternal. Denies feeling anxious when chest pain starts. Still denying SHOB, dizziness, lightheadedness, nausea, vomiting, or palpitations. She reports since last visit, she had some back to back days of feeling chest pain, but then didn't have any chest pain for a few days. Denies chest pain today. This has been occurring for years and but the last few years it has started to increase in frequency.    ROS See HPI above     Objective:   BP 130/84 (BP Location: Left Arm, Patient Position: Sitting, Cuff Size:  Normal)   Pulse 89   Temp 98.2 F (36.8 C) (Oral)   Wt 198 lb 9.6 oz (90.1 kg)   SpO2 98%   BMI 30.20 kg/m    Physical Exam Vitals reviewed.  Constitutional:      General: She is not in acute distress.    Appearance: Normal appearance. She is obese. She is not ill-appearing, toxic-appearing or diaphoretic.  HENT:     Head: Normocephalic and atraumatic.  Eyes:     General:        Right eye: No discharge.        Left eye: No discharge.     Conjunctiva/sclera: Conjunctivae normal.  Cardiovascular:     Rate and Rhythm: Normal rate and regular rhythm.     Heart sounds: Normal heart sounds. No murmur heard.    No friction rub. No gallop.  Pulmonary:     Effort: Pulmonary effort is normal. No respiratory distress.     Breath sounds: Normal breath sounds.  Musculoskeletal:        General: Normal range of motion.  Skin:    General: Skin is warm and dry.  Neurological:     General: No focal deficit present.     Mental Status: She is alert and oriented to person, place, and time. Mental status is at baseline.  Psychiatric:        Mood and Affect: Mood normal.        Behavior: Behavior normal.  Thought Content: Thought content normal.        Judgment: Judgment normal.      Assessment & Plan:  Chest pain, unspecified type -     Ambulatory referral to Cardiology  -Reviewed lab results from previous visit. Recommend to increase fiber and antioxidants to increase your good cholesterol (HDL) and decrease greasy, fatty foods, decrease fast food for your bad cholesterol (LDL). Best diet to follow from a cardiac standpoint is a Mediterranean diet.  -Placed a referral to cardiology for continued chest pain with no etiology. Please call the office or send a MyChart message if you do not receive a phone call or MyChart message about appointment in 2 weeks.  -Follow up in 6 months a physical.   Zandra Abts, NP

## 2023-01-15 NOTE — Patient Instructions (Signed)
-  Reviewed lab results from previous visit. Recommend to increase fiber and antioxidants to increase your good cholesterol (HDL) and decrease greasy, fatty foods, decrease fast food for your bad cholesterol (LDL). Best diet to follow from a cardiac standpoint is a Mediterranean diet.  -Placed a referral to cardiology for continued chest pain with no etiology. Please call the office or send a MyChart message if you do not receive a phone call or MyChart message about appointment in 2 weeks.  -Follow up in 6 months a physical.

## 2023-01-19 ENCOUNTER — Ambulatory Visit: Payer: 59 | Attending: Cardiovascular Disease | Admitting: Cardiovascular Disease

## 2023-01-19 ENCOUNTER — Encounter: Payer: Self-pay | Admitting: Cardiovascular Disease

## 2023-01-19 VITALS — BP 118/76 | HR 79 | Ht 68.0 in | Wt 198.6 lb

## 2023-01-19 DIAGNOSIS — R079 Chest pain, unspecified: Secondary | ICD-10-CM

## 2023-01-19 NOTE — Patient Instructions (Signed)
Medication Instructions:  No changes *If you need a refill on your cardiac medications before your next appointment, please call your pharmacy*   Lab Work: none   Testing/Procedures: Your physician has requested that you have an echocardiogram. Echocardiography is a painless test that uses sound waves to create images of your heart. It provides your doctor with information about the size and shape of your heart and how well your heart's chambers and valves are working. This procedure takes approximately one hour. There are no restrictions for this procedure. Please do NOT wear cologne, perfume, aftershave, or lotions (deodorant is allowed). Please arrive 15 minutes prior to your appointment time.   Follow-Up: As needed  

## 2023-01-19 NOTE — Progress Notes (Signed)
Chief Complaint  Patient presents with   New Patient (Initial Visit)    Chest pain   History of Present Illness: 32 yo female with no significant past medical history who is here today as a new consult, referred by Vanna Scotland, NP, for the evaluation of chest pain. She has been having chest pain for several years. The pain is described as sharp and lasting for several seconds. The pain occurs in different parts of her chest. It often occurs when she is under stress. The pain is "light" and sharp. There is not associated dyspnea or diaphoresis. The pain occurs at rest. No chest pain with exertion. She has never smoked. No family history of premature CAD.   Primary Care Physician: Alveria Apley, NP   Past Medical History:  Diagnosis Date   Asthma    Bacterial vaginitis    Depression    Female pelvic inflammatory disease 10/25/2019   Frequent headaches    Herpes genitalis in women    History of asthma 11/09/2017   Childhood    HSV (herpes simplex virus) 09/19/2018   PPX at 36 weeks   UTI (urinary tract infection)     Past Surgical History:  Procedure Laterality Date   NO PAST SURGERIES      Current Outpatient Medications  Medication Sig Dispense Refill   levonorgestrel (LILETTA, 52 MG,) 20.1 MCG/DAY IUD IUD 1 each by Intrauterine route once.     Probiotic Product (PROBIOTIC DAILY PO) Take by mouth as needed.     valACYclovir (VALTREX) 500 MG tablet Take 1 tablet (500 mg total) by mouth daily as needed (outbreak). 30 tablet 5   No current facility-administered medications for this visit.    No Known Allergies  Social History   Socioeconomic History   Marital status: Single    Spouse name: Not on file   Number of children: 1   Years of education: Not on file   Highest education level: Bachelor's degree (e.g., BA, AB, BS)  Occupational History   Occupation: Customer service  Tobacco Use   Smoking status: Never   Smokeless tobacco: Never  Vaping Use    Vaping status: Never Used  Substance and Sexual Activity   Alcohol use: Yes    Comment: Once a month   Drug use: No   Sexual activity: Yes    Birth control/protection: None    Comment: IUD plans at first  post partum visit  Other Topics Concern   Not on file  Social History Narrative   Not on file   Social Determinants of Health   Financial Resource Strain: Medium Risk (01/11/2023)   Overall Financial Resource Strain (CARDIA)    Difficulty of Paying Living Expenses: Somewhat hard  Food Insecurity: No Food Insecurity (01/11/2023)   Hunger Vital Sign    Worried About Running Out of Food in the Last Year: Never true    Ran Out of Food in the Last Year: Never true  Transportation Needs: No Transportation Needs (01/11/2023)   PRAPARE - Administrator, Civil Service (Medical): No    Lack of Transportation (Non-Medical): No  Physical Activity: Inactive (01/11/2023)   Exercise Vital Sign    Days of Exercise per Week: 0 days    Minutes of Exercise per Session: 0 min  Stress: Stress Concern Present (01/11/2023)   [employer] of Occupational Health - Occupational Stress Questionnaire    Feeling of Stress : To some extent  Social Connections: Socially Isolated (01/11/2023)  Social Connection and Isolation Panel [NHANES]    Frequency of Communication with Friends and Family: Twice a week    Frequency of Social Gatherings with Friends and Family: Three times a week    Attends Religious Services: Never    Active Member of Clubs or Organizations: No    Attends Banker Meetings: Not on file    Marital Status: Never married  Intimate Partner Violence: Not At Risk (01/01/2023)   Humiliation, Afraid, Rape, and Kick questionnaire    Fear of Current or Ex-Partner: No    Emotionally Abused: No    Physically Abused: No    Sexually Abused: No    Family History  Problem Relation Age of Onset   Hypertension Mother    Hypertension Father    Hyperlipidemia Father     Asthma Sister    Diabetes Maternal Grandmother    Glaucoma Maternal Grandmother    Heart disease Maternal Grandmother    Heart disease Paternal Grandmother    Cancer Paternal Grandfather     Review of Systems:  As stated in the HPI and otherwise negative.   BP 118/76   Pulse 79   Ht 5\' 8"  (1.727 m)   Wt 90.1 kg   SpO2 99%   BMI 30.20 kg/m   Physical Examination: General: Well developed, well nourished, NAD  HEENT: OP clear, mucus membranes moist  SKIN: warm, dry. No rashes. Neuro: No focal deficits  Musculoskeletal: Muscle strength 5/5 all ext  Psychiatric: Mood and affect normal  Neck: No JVD, no carotid bruits, no thyromegaly, no lymphadenopathy.  Lungs:Clear bilaterally, no wheezes, rhonci, crackles Cardiovascular: Regular rate and rhythm. No murmurs, gallops or rubs. Abdomen:Soft. Bowel sounds present. Non-tender.  Extremities: No lower extremity edema. Pulses are 2 + in the bilateral DP/PT.  EKG:  EKG is ordered today. The ekg ordered today demonstrates  EKG Interpretation Date/Time:  Tuesday January 19 2023 15:36:11 EDT Ventricular Rate:  79 PR Interval:  132 QRS Duration:  78 QT Interval:  364 QTC Calculation: 417 R Axis:   59  Text Interpretation: Normal sinus rhythm Normal ECG No previous ECGs available Confirmed by Verne Carrow [phone]) on 01/19/2023 3:38:52 PM    Recent Labs: 01/01/2023: ALT 6; BUN 9; Creat 0.71; Hemoglobin 13.3; Magnesium 2.1; Platelets 315; Potassium 4.6; Sodium 141; TSH 1.44   Lipid Panel    Component Value Date/Time   CHOL 169 01/01/2023 1533   TRIG 70 01/01/2023 1533   HDL 46 (L) 01/01/2023 1533   CHOLHDL 3.7 01/01/2023 1533   VLDL 16.2 09/24/2021 1420   LDLCALC 107 (H) 01/01/2023 1533     Wt Readings from Last 3 Encounters:  01/19/23 90.1 kg  01/15/23 90.1 kg  01/01/23 89.9 kg    Assessment and Plan:   1. Atypical chest pain: She has resting sharp chest pains. Her cardiac examination is normal. EKG is  normal. Her pain does not sound cardiac related. Will arrange echo to assess LV wall motion and exclude structural heart disease. I do not think ischemic testing is indicated.   Labs/ tests ordered today include:   Orders Placed This Encounter  Procedures   EKG 12-Lead   ECHOCARDIOGRAM COMPLETE   Disposition:   F/U with me as needed.   Signed, Verne Carrow, MD, [facility] 01/19/2023 4:27 PM    [facility] [address] [place], [place], [place]  [address] Phone: [phone]; Fax: [phone]

## 2023-02-18 ENCOUNTER — Encounter: Payer: 59 | Admitting: Obstetrics and Gynecology

## 2023-02-19 ENCOUNTER — Ambulatory Visit (HOSPITAL_COMMUNITY): Payer: 59 | Attending: Cardiovascular Disease

## 2023-02-19 DIAGNOSIS — R079 Chest pain, unspecified: Secondary | ICD-10-CM | POA: Diagnosis present

## 2023-02-19 LAB — ECHOCARDIOGRAM COMPLETE
Area-P 1/2: 4.99 cm2
MV M vel: 5.15 m/s
MV Peak grad: 106.1 mm[Hg]
S' Lateral: 3.4 cm

## 2023-03-03 ENCOUNTER — Ambulatory Visit: Payer: 59 | Admitting: Obstetrics and Gynecology

## 2023-03-03 ENCOUNTER — Encounter: Payer: Self-pay | Admitting: Obstetrics and Gynecology

## 2023-03-03 ENCOUNTER — Other Ambulatory Visit (HOSPITAL_COMMUNITY)
Admission: RE | Admit: 2023-03-03 | Discharge: 2023-03-03 | Disposition: A | Discharge status: Home / Self Care | Payer: 59 | Source: Ambulatory Visit | Attending: Obstetrics and Gynecology | Admitting: Obstetrics and Gynecology

## 2023-03-03 ENCOUNTER — Other Ambulatory Visit: Payer: Self-pay

## 2023-03-03 VITALS — BP 131/84 | HR 75 | Ht 68.0 in | Wt 195.6 lb

## 2023-03-03 DIAGNOSIS — N898 Other specified noninflammatory disorders of vagina: Secondary | ICD-10-CM | POA: Diagnosis not present

## 2023-03-03 DIAGNOSIS — Z30432 Encounter for removal of intrauterine contraceptive device: Secondary | ICD-10-CM | POA: Diagnosis not present

## 2023-03-03 DIAGNOSIS — Z124 Encounter for screening for malignant neoplasm of cervix: Secondary | ICD-10-CM | POA: Diagnosis present

## 2023-03-03 DIAGNOSIS — Z113 Encounter for screening for infections with a predominantly sexual mode of transmission: Secondary | ICD-10-CM | POA: Diagnosis not present

## 2023-03-03 HISTORY — PX: IUD REMOVAL: OBO 1004

## 2023-03-03 NOTE — Procedures (Signed)
Intrauterine Device (IUD) Removal Procedure Note  EGBUS normal. Vaginal vault normal. Cervix normal with IUD strings seen (approx 3-4cm in length). Strings grasped with ringed forceps and easily removed and noted to be intact.   No complications, patient tolerated the procedure well.  Cornelia Copa MD Attending [facility] [employer] (Faculty Practice) 03/03/2023

## 2023-03-03 NOTE — Progress Notes (Signed)
Obstetrics and Gynecology Visit Return Patient Evaluation  Appointment Date: 03/03/2023  Primary Care Provider: [facility]  Chief Complaint: pap smear and IUD removal  History of Present Illness:  Amanda Kerr is a 32 y.o. patient has had Liletta for past two years and would like it removed due to low libido and mood issues. LMP just finishing  Review of Systems: as noted in the History of Present Illness.  Patient Active Problem List   Diagnosis Date Noted   Breakthrough bleeding associated with intrauterine device (IUD) 08/08/2020   Adjustment disorder 10/25/2019   Anxiety and depression 11/09/2017   Allodynia 11/09/2017   Medications:  [name] had no medications administered during this visit. Current Outpatient Medications  Medication Sig Dispense Refill   levonorgestrel (LILETTA, 52 MG,) 20.1 MCG/DAY IUD IUD 1 each by Intrauterine route once.     Probiotic Product (PROBIOTIC DAILY PO) Take by mouth as needed.     valACYclovir (VALTREX) 500 MG tablet Take 1 tablet (500 mg total) by mouth daily as needed (outbreak). 30 tablet 5   No current facility-administered medications for this visit.    Allergies: has No Known Allergies.  Physical Exam:  BP 131/84   Pulse 75   Ht 5\' 8"  (1.727 m)   Wt 195 lb 9.6 oz (88.7 kg)   LMP 02/25/2023 (Exact Date)   BMI 29.74 kg/m  Body mass index is 29.74 kg/m. General appearance: Well nourished, well developed female in no acute distress.  Abdomen: diffusely non tender to palpation, non distended, and no masses, hernias Neuro/Psych:  Normal mood and affect.    Pelvic exam:  Cervical exam performed in the presence of a chaperone EGBUS: normal Vaginal vault: scant yellow malodorous d/c in vault Cervix:  IUD strings 3-4cm and removed w/o issue Bimanual: deferred   Assessment: patient stable  Plan:  1. Cervical cancer screening - Cytology  - PAP( [place])  2. Screen for STD (sexually transmitted disease) Declines bloodwork - Cytology - PAP( [place]) - Cervicovaginal ancillary only( [place])  3. Vaginal discharge - Cervicovaginal ancillary only( [place])  4. Encounter for IUD removal Declines anything for contraception   RTC: PRN  Return if symptoms worsen or fail to improve.  Future Appointments  Date Time Provider Department Center  07/16/2023  8:40 AM Alveria Apley, NP [facility]    Cornelia Copa MD Attending Center for [facility])

## 2023-03-05 ENCOUNTER — Encounter (HOSPITAL_BASED_OUTPATIENT_CLINIC_OR_DEPARTMENT_OTHER): Payer: Self-pay

## 2023-03-05 ENCOUNTER — Encounter: Payer: Self-pay | Admitting: Obstetrics and Gynecology

## 2023-03-05 ENCOUNTER — Emergency Department (HOSPITAL_BASED_OUTPATIENT_CLINIC_OR_DEPARTMENT_OTHER)
Admission: EM | Admit: 2023-03-05 | Discharge: 2023-03-05 | Disposition: A | Discharge status: Home / Self Care | Payer: 59

## 2023-03-05 DIAGNOSIS — N939 Abnormal uterine and vaginal bleeding, unspecified: Secondary | ICD-10-CM | POA: Diagnosis present

## 2023-03-05 LAB — CERVICOVAGINAL ANCILLARY ONLY
Bacterial Vaginitis (gardnerella): POSITIVE — AB
Candida Glabrata: NEGATIVE
Candida Vaginitis: NEGATIVE
Comment: NEGATIVE
Comment: NEGATIVE
Comment: NEGATIVE

## 2023-03-05 NOTE — ED Triage Notes (Signed)
Pt reports having IUD removed Wednesday and reports heavier than typical vaginal bleeding that began yesterday.   Denies abdominal pain, fever, nausea.  Reports mild fatigue.  Pt reports changing her menstrual pad every hour, though it's "usually only about half soaked."

## 2023-03-05 NOTE — Discharge Instructions (Signed)
As discussed please follow-up with your OB/GYN.  Return immediately if develop fevers, chills, abdominal pain, bleed to 2 pads an hour for 3 hours or develop any new or worsening symptoms that are concerning to you.

## 2023-03-05 NOTE — ED Provider Notes (Signed)
[place] EMERGENCY DEPARTMENT AT [facility] Provider Note   CSN: [id number] Arrival date & time: 03/05/23  1803     History  Chief Complaint  Patient presents with   Vaginal Bleeding    Amanda Kerr is a 32 y.o. female.  This is a 32 year old female present emergency department for vaginal bleeding.  She had IUD removed Wednesday with Pap smear.  Reports she started bleeding yesterday and continued today.  No abdominal pain no nausea or vomiting.  No fevers chills.  States that bleeding is similar to period.   Vaginal Bleeding      Home Medications Prior to Admission medications   Medication Sig Start Date End Date Taking? Authorizing Provider  Probiotic Product (PROBIOTIC DAILY PO) Take by mouth as needed.    [provider]  valACYclovir (VALTREX) 500 MG tablet Take 1 tablet (500 mg total) by mouth daily as needed (outbreak). 02/25/22   Deeann Saint, MD      Allergies    Patient has no known allergies.    Review of Systems   Review of Systems  Genitourinary:  Positive for vaginal bleeding.    Physical Exam Updated Vital Signs BP 124/86 (BP Location: Left Arm)   Pulse 87   Temp 98.6 F (37 C)   Resp 18   LMP 03/03/2023 (Exact Date)   SpO2 100%  Physical Exam Vitals and nursing note reviewed. Exam conducted with a chaperone present.  Constitutional:      General: She is not in acute distress.    Appearance: She is not toxic-appearing.  HENT:     Head: Normocephalic.     Nose: Nose normal.     Mouth/Throat:     Mouth: Mucous membranes are moist.  Eyes:     Conjunctiva/sclera: Conjunctivae normal.  Cardiovascular:     Rate and Rhythm: Normal rate and regular rhythm.  Pulmonary:     Effort: Pulmonary effort is normal.  Abdominal:     General: Abdomen is flat. There is no distension.     Tenderness: There is no abdominal tenderness. There is no guarding or rebound.  Genitourinary:    General: Normal vulva.     Labia:         Right: No rash or lesion.        Left: No rash or lesion.      Vagina: Normal. No signs of injury and foreign body.     Cervix: Cervical bleeding present. No lesion.     Rectum: Normal.  Musculoskeletal:     Cervical back: Normal range of motion.  Neurological:     Mental Status: She is alert.     ED Results / Procedures / Treatments   Labs (all labs ordered are listed, but only abnormal results are displayed) Labs Reviewed - No data to display  EKG None  Radiology No results found.  Procedures Procedures    Medications Ordered in ED Medications - No data to display  ED Course/ Medical Decision Making/ A&P                                 Medical Decision Making Well-appearing 32 year old female present emergency department for vaginal bleeding after having IUD removed and Pap smear.  Afebrile nontachycardic Mannam stable.  She has not having a significant amount of bleeding at this time.  Pelvic exam with some minor maroon-colored blood from cervical os.  No  clots, no obvious lacerations from recent procedure.  She notes that her bleeding is similar to prior menstrual periods.  She may have started menstruation versus complication from IUD removal.  She has soft nontender abdomen, and no obvious lacerations visible.  Discussed watchful waiting and follow-up with OB/GYN.  Patient agreeable to plan.  Stable for discharge at this time.  Amount and/or Complexity of Data Reviewed Labs:     Details: Consider labs, however given the amount of bleeding and time course low suspicion for acute blood loss anemia.  Low suspicion for systemic infection.  Considered UA, however not having any urinary symptoms.          Final Clinical Impression(s) / ED Diagnoses Final diagnoses:  None    Rx / DC Orders ED Discharge Orders     None         Coral Spikes, DO 03/05/23 2123

## 2023-03-08 ENCOUNTER — Other Ambulatory Visit: Payer: Self-pay | Admitting: Lactation Services

## 2023-03-08 MED ORDER — METRONIDAZOLE 500 MG PO TABS: 500.0000 mg | ORAL_TABLET | Freq: Two times a day (BID) | ORAL | 0 refills | Status: DC

## 2023-03-08 NOTE — Progress Notes (Signed)
Treatment for BV sent to Pharmacy at patiens request with + results noted in chart.

## 2023-03-10 LAB — CYTOLOGY - PAP
Chlamydia: NEGATIVE
Comment: NEGATIVE
Comment: NEGATIVE
Comment: NEGATIVE
Comment: NORMAL
High risk HPV: NEGATIVE
Neisseria Gonorrhea: NEGATIVE
Trichomonas: NEGATIVE

## 2023-03-12 ENCOUNTER — Encounter: Payer: Self-pay | Admitting: Obstetrics and Gynecology

## 2023-03-12 DIAGNOSIS — R87619 Unspecified abnormal cytological findings in specimens from cervix uteri: Secondary | ICD-10-CM | POA: Insufficient documentation

## 2023-03-15 ENCOUNTER — Encounter: Payer: Self-pay | Admitting: Obstetrics and Gynecology

## 2023-03-15 ENCOUNTER — Telehealth: Payer: Self-pay | Admitting: General Practice

## 2023-03-15 NOTE — Telephone Encounter (Signed)
Called patient regarding mychart message. Explained colposcopy procedure to patient and it's helpfulness in detecting precancerous cells of the cervix. Discussed that based on results, sometimes follow up treatment is needed. Also discussed someone from the front office would contact her regarding an appt. Patient verbalized understanding.

## 2023-04-13 ENCOUNTER — Ambulatory Visit: Payer: 59 | Admitting: Obstetrics and Gynecology

## 2023-04-13 ENCOUNTER — Other Ambulatory Visit: Payer: Self-pay

## 2023-04-13 DIAGNOSIS — R87619 Unspecified abnormal cytological findings in specimens from cervix uteri: Secondary | ICD-10-CM | POA: Diagnosis not present

## 2023-04-13 NOTE — Progress Notes (Signed)
  Obstetrics and Gynecology Visit Return Patient Evaluation  Appointment Date: 04/13/2023  Primary Care Provider: Billy Philippe [facility]: [facility] for [facility]   Chief Complaint: here for colpo and embx  History of Present Illness:  Patient here for colpo for AGUS-NOS, HPV negative pap smear on 12/4. Patient wanted Liletta  removed, which is was on that date, and she declined anything for contraception. Patient had unprotected intercourse 2 days ago with LMP around Christmas.  Review of Systems: as noted in the History of Present Illness.  Medications:  Amanda Kerr had no medications administered during this visit. Current Outpatient Medications  Medication Sig Dispense Refill   metroNIDAZOLE  (FLAGYL ) 500 MG tablet Take 1 tablet (500 mg total) by mouth 2 (two) times daily. 14 tablet 0   valACYclovir  (VALTREX ) 500 MG tablet Take 1 tablet (500 mg total) by mouth daily as needed (outbreak). 30 tablet 5   Probiotic Product (PROBIOTIC DAILY PO) Take by mouth as needed.     No current facility-administered medications for this visit.    Allergies: has no known allergies.  Physical Exam:  LMP 03/24/2023   Breastfeeding No  There is no height or weight on file to calculate BMI. General appearance: Well nourished, well developed female in no acute distress.   Assessment: patient stable  Plan: D/w her and recommend abstinence and RTC in 3wks for UPT and procedure.   Return in about 3 weeks (around 05/04/2023) for colpo and endo biopsy with any MD.  Future Appointments  Date Time Provider Department Center  05/11/2023 10:15 AM Amanda Crutch, MD [facility]  07/16/2023  8:40 AM Amanda Philippe JONELLE, NP [facility]    [name] Attending Center for [facility])

## 2023-05-10 NOTE — Progress Notes (Signed)
    GYNECOLOGY OFFICE COLPOSCOPY PROCEDURE NOTE  33 y.o. G2P1011 here for colposcopy for glandular cell abnormality (AGUS) pap smear on 02/2023. Discussed role for HPV in cervical dysplasia, need for surveillance.  Patient gave informed written consent, time out was performed.  Placed in lithotomy position. Cervix viewed with speculum and colposcope after application of acetic acid.   Colposcopy adequate? Yes  acetowhite lesion(s) noted at 12 and 5 o'clock; corresponding biopsies obtained.  ECC specimen obtained. All specimens were labeled and sent to pathology.  Chaperone was present during entire procedure.  Patient was given post procedure instructions.  Will follow up pathology and manage accordingly; patient will be contacted with results and recommendations.  Routine preventative health maintenance measures emphasized.  Endometrial Biopsy Procedure  Patient identified, informed consent performed,  indication reviewed, consent signed.  Reviewed risk of perforation, pain, bleeding, insufficient sample, etc were reviewd. Time out was performed.  Urine pregnancy test negative.  Speculum placed in the vagina.  Cervix visualized.  Cleaned with Betadine x 2.  Anterior cervix grasped anteriorly with a single tooth tenaculum.  Paracervical block was not administered.  Endometrial pipelle was used to draw up 1cc of 1% lidocaine, introduced into the cervical os and instilled into the endometrial cavity.  The pipelle was passed twice without difficulty and sample obtained. Tenaculum was removed, good hemostasis noted.  Patient tolerated procedure well.  Patient was given post-procedure instructions.    Lorriane Shire, MD, FACOG Minimally Invasive Gynecologic Surgery  Obstetrics and Gynecology, [facility] for [facility], [facility] 05/17/2023

## 2023-05-11 ENCOUNTER — Other Ambulatory Visit: Payer: Self-pay

## 2023-05-11 ENCOUNTER — Other Ambulatory Visit (HOSPITAL_COMMUNITY)
Admission: RE | Admit: 2023-05-11 | Discharge: 2023-05-11 | Disposition: A | Discharge status: Home / Self Care | Payer: 59 | Source: Ambulatory Visit | Attending: Obstetrics and Gynecology | Admitting: Obstetrics and Gynecology

## 2023-05-11 ENCOUNTER — Ambulatory Visit: Payer: 59 | Admitting: Obstetrics and Gynecology

## 2023-05-11 VITALS — BP 118/87 | HR 106 | Wt 196.7 lb

## 2023-05-11 DIAGNOSIS — Z1331 Encounter for screening for depression: Secondary | ICD-10-CM

## 2023-05-11 DIAGNOSIS — R87619 Unspecified abnormal cytological findings in specimens from cervix uteri: Secondary | ICD-10-CM

## 2023-05-11 DIAGNOSIS — Z3202 Encounter for pregnancy test, result negative: Secondary | ICD-10-CM

## 2023-05-11 LAB — POCT PREGNANCY, URINE: Preg Test, Ur: NEGATIVE

## 2023-05-11 NOTE — Patient Instructions (Signed)
Wait at least 24 hours before having intercourse, ideally 3 days.

## 2023-05-13 LAB — SURGICAL PATHOLOGY

## 2023-05-14 ENCOUNTER — Encounter: Payer: Self-pay | Admitting: Obstetrics and Gynecology

## 2023-05-20 ENCOUNTER — Encounter: Payer: Self-pay | Admitting: General Practice

## 2023-06-14 ENCOUNTER — Inpatient Hospital Stay (HOSPITAL_COMMUNITY)
Admission: AD | Admit: 2023-06-14 | Discharge: 2023-06-14 | Disposition: A | Discharge status: Home / Self Care | Attending: Obstetrics and Gynecology | Admitting: Obstetrics and Gynecology

## 2023-06-14 ENCOUNTER — Other Ambulatory Visit: Payer: Self-pay

## 2023-06-14 DIAGNOSIS — R7989 Other specified abnormal findings of blood chemistry: Secondary | ICD-10-CM

## 2023-06-14 DIAGNOSIS — Z3A01 Less than 8 weeks gestation of pregnancy: Secondary | ICD-10-CM | POA: Insufficient documentation

## 2023-06-14 DIAGNOSIS — N920 Excessive and frequent menstruation with regular cycle: Secondary | ICD-10-CM | POA: Insufficient documentation

## 2023-06-14 DIAGNOSIS — O26891 Other specified pregnancy related conditions, first trimester: Secondary | ICD-10-CM | POA: Diagnosis not present

## 2023-06-14 DIAGNOSIS — O26851 Spotting complicating pregnancy, first trimester: Secondary | ICD-10-CM | POA: Insufficient documentation

## 2023-06-14 LAB — HCG, QUANTITATIVE, PREGNANCY: hCG, Beta Chain, Quant, S: 5 m[IU]/mL — ABNORMAL HIGH (ref <5)

## 2023-06-14 LAB — POCT PREGNANCY, URINE: Preg Test, Ur: NEGATIVE

## 2023-06-14 NOTE — MAU Note (Signed)
 Amanda Kerr is a 33 y.o. at Unknown here in MAU reporting: she had a positive HPT on Saturday and is having scant to light VB.  Denies pain, states having"light cramping".  LMP: 05/15/2023 Onset of complaint: Sunday Pain score: 0 Vitals:   06/14/23 1001  BP: 128/86  Pulse: 84  Resp: 18  Temp: 98.8 F (37.1 C)  SpO2: 100%     FHT: NA  Lab orders placed from triage: UPT

## 2023-06-14 NOTE — MAU Provider Note (Signed)
 History     CSN: [id number]  Arrival date and time: 06/14/23 8469   Event Date/Time   First Provider Initiated Contact with Patient 06/14/23 1218      Chief Complaint  Patient presents with   Vaginal Bleeding   Amanda Kerr , a  33 y.o. G2P1011 at Unknown presents to MAU with complaints of vaginal spotting that began yesterday.  Patient reports today more vaginal bleeding similar to her menstrual period with mild cramping that she currently rates as a 2 out of 10.  She denies attempting to relieve symptoms.  Last menstrual period was February 13.  As of today she reports being 3 days late by app.  Patient reports 3 positive home test with very very faint lines.  Patient recently reports emotional stress.  Last intercourse was day before yesterday.  This is a desired pregnancy.  Denies abnormal vaginal discharge prior to this.  Denies urinary symptoms.          OB History     Gravida  2   Para  1   Term  1   Preterm      AB  1   Living  1      SAB  1   IAB      Ectopic      Multiple  0   Live Births  1           Past Medical History:  Diagnosis Date   Asthma    Bacterial vaginitis    Breakthrough bleeding associated with intrauterine device (IUD) 08/08/2020   Depression    Female pelvic inflammatory disease 10/25/2019   Frequent headaches    Herpes genitalis in women    History of asthma 11/09/2017   Childhood    HSV (herpes simplex virus) 09/19/2018   PPX at 36 weeks   UTI (urinary tract infection)     Past Surgical History:  Procedure Laterality Date   IUD REMOVAL  03/03/2023   NO PAST SURGERIES      Family History  Problem Relation Age of Onset   Hypertension Mother    Hypertension Father    Hyperlipidemia Father    Asthma Sister    Diabetes Maternal Grandmother    Glaucoma Maternal Grandmother    Heart disease Maternal Grandmother    Heart disease Paternal Grandmother    Cancer Paternal Grandfather     Social History    Tobacco Use   Smoking status: Never   Smokeless tobacco: Never  Vaping Use   Vaping status: Never Used  Substance Use Topics   Alcohol use: Yes    Comment: Once a month   Drug use: No    Allergies: No Known Allergies  Medications Prior to Admission  Medication Sig Dispense Refill Last Dose/Taking   valACYclovir (VALTREX) 500 MG tablet Take 1 tablet (500 mg total) by mouth daily as needed (outbreak). 30 tablet 5     Review of Systems  Constitutional:  Negative for chills, fatigue and fever.  Eyes:  Negative for pain and visual disturbance.  Respiratory:  Negative for apnea, shortness of breath and wheezing.   Cardiovascular:  Negative for chest pain and palpitations.  Gastrointestinal:  Negative for abdominal pain, constipation, diarrhea, nausea and vomiting.  Genitourinary:  Positive for vaginal bleeding. Negative for difficulty urinating, dysuria, pelvic pain, vaginal discharge and vaginal pain.  Musculoskeletal:  Negative for back pain.  Neurological:  Negative for seizures, weakness and headaches.  Psychiatric/Behavioral:  Negative for suicidal  ideas.    Physical Exam   Blood pressure 128/86, pulse 84, temperature 98.8 F (37.1 C), temperature source Oral, resp. rate 18, height 5\' 8"  (1.727 m), weight 88.9 kg, last menstrual period 05/15/2023, SpO2 100%.  Physical Exam Vitals and nursing note reviewed.  Constitutional:      General: She is not in acute distress.    Appearance: Normal appearance.  HENT:     Head: Normocephalic.  Pulmonary:     Effort: Pulmonary effort is normal.  Musculoskeletal:     Cervical back: Normal range of motion.  Skin:    General: Skin is warm and dry.  Neurological:     Mental Status: She is alert and oriented to person, place, and time.  Psychiatric:        Mood and Affect: Mood normal.     MAU Course  Procedures Orders Placed This Encounter  Procedures   hCG, quantitative, pregnancy   hCG, quantitative, pregnancy    Pregnancy, urine POC   Discharge patient Discharge disposition: 01-Home or Self Care; Discharge patient date: 06/14/2023    MDM -Negative urine pregnancy test here in MAU. -Beta quant of 5.  Concern for late menses versus pregnancy.  Plan for discharge.  Assessment and Plan   1. Elevated serum hCG   2. Spotting   3. Less than [redacted] weeks gestation of pregnancy    -Reviewed test results with patient.  Discussed possibility of early pregnancy versus late menstrual period. -Recommendation for repeat quant in 1 week. -Appointment scheduled for March 24 at 930 for repeat lab draw. -Reviewed worsening signs and return precautions. -Ectopic precautions provided. -Patient discharged home in stable condition and may return to MAU as needed.  Claudette Head, MSN CNM  06/14/2023, 12:18 PM

## 2023-06-21 ENCOUNTER — Other Ambulatory Visit: Payer: Self-pay

## 2023-07-15 ENCOUNTER — Ambulatory Visit (INDEPENDENT_AMBULATORY_CARE_PROVIDER_SITE_OTHER): Admitting: Family Medicine

## 2023-07-15 ENCOUNTER — Encounter: Payer: Self-pay | Admitting: Family Medicine

## 2023-07-15 VITALS — BP 124/78 | HR 82 | Temp 98.4°F | Ht 68.0 in | Wt 196.0 lb

## 2023-07-15 DIAGNOSIS — Z0000 Encounter for general adult medical examination without abnormal findings: Secondary | ICD-10-CM | POA: Diagnosis not present

## 2023-07-15 NOTE — Progress Notes (Signed)
 Complete physical exam  Patient: Amanda Kerr   DOB: [birth year]   33 y.o. Female  MRN: [id number]  Subjective:    Chief Complaint  Patient presents with   Annual Exam    Amanda Kerr is a 33 y.o. female who presents today for a complete physical exam. She reports consuming a general diet. The patient does not participate in regular exercise at present. She generally feels fairly well. She reports sleeping well. She does have additional problems to discuss today.    Most recent fall risk assessment:    01/01/2023    2:56 PM  Fall Risk   Falls in the past year? 0  Number falls in past yr: 0  Injury with Fall? 0  Risk for fall due to : No Fall Risks  Follow up Falls evaluation completed     Most recent depression screenings:    07/15/2023   10:02 AM 05/14/2023   12:06 PM  PHQ 2/9 Scores  PHQ - 2 Score 1 1  PHQ- 9 Score 4 5    Vision:Not within last year  and Dental: No current dental problems and Receives regular dental care  Past Medical History:  Diagnosis Date   Asthma    Bacterial vaginitis    Breakthrough bleeding associated with intrauterine device (IUD) 08/08/2020   Depression    Female pelvic inflammatory disease 10/25/2019   Frequent headaches    Herpes genitalis in women    History of asthma 11/09/2017   Childhood    HSV (herpes simplex virus) 09/19/2018   PPX at 36 weeks   UTI (urinary tract infection)    Past Surgical History:  Procedure Laterality Date   IUD REMOVAL  03/03/2023   NO PAST SURGERIES     Social History   Tobacco Use   Smoking status: Never   Smokeless tobacco: Never  Vaping Use   Vaping status: Never Used  Substance Use Topics   Alcohol use: Yes    Comment: Once a month   Drug use: No   Social History   Socioeconomic History   Marital status: Single    Spouse name: Not on file   Number of children: 1   Years of education: Not on file   Highest education level: Bachelor's degree (e.g., BA, AB, BS)   Occupational History   Occupation: Customer service  Tobacco Use   Smoking status: Never   Smokeless tobacco: Never  Vaping Use   Vaping status: Never Used  Substance and Sexual Activity   Alcohol use: Yes    Comment: Once a month   Drug use: No   Sexual activity: Yes    Birth control/protection: None    Comment: IUD plans at first  post partum visit  Other Topics Concern   Not on file  Social History Narrative   Not on file   Social Drivers of Health   Financial Resource Strain: Medium Risk (07/14/2023)   Overall Financial Resource Strain (CARDIA)    Difficulty of Paying Living Expenses: Somewhat hard  Food Insecurity: No Food Insecurity (07/14/2023)   Hunger Vital Sign    Worried About Running Out of Food in the Last Year: Never true    Ran Out of Food in the Last Year: Never true  Transportation Needs: No Transportation Needs (07/14/2023)   PRAPARE - Administrator, Civil Service (Medical): No    Lack of Transportation (Non-Medical): No  Physical Activity: Insufficiently Active (07/14/2023)   Exercise Vital Sign  Days of Exercise per Week: 1 day    Minutes of Exercise per Session: 30 min  Stress: Stress Concern Present (07/14/2023)   [employer] of Occupational Health - Occupational Stress Questionnaire    Feeling of Stress : To some extent  Social Connections: Moderately Isolated (07/14/2023)   Social Connection and Isolation Panel [NHANES]    Frequency of Communication with Friends and Family: Twice a week    Frequency of Social Gatherings with Friends and Family: Three times a week    Attends Religious Services: Never    Active Member of Clubs or Organizations: No    Attends Banker Meetings: Not on file    Marital Status: Living with partner  Intimate Partner Violence: Not At Risk (01/01/2023)   Humiliation, Afraid, Rape, and Kick questionnaire    Fear of Current or Ex-Partner: No    Emotionally Abused: No    Physically Abused: No     Sexually Abused: No   Family Status  Relation Name Status   Mother  Alive   Father  Alive   Sister  Alive   Sister  Alive   Son  Alive   MGM  Alive   MGF  Deceased   PGM  Deceased   PGF  Deceased  No partnership data on file   Family History  Problem Relation Age of Onset   Hypertension Mother    Hypertension Father    Hyperlipidemia Father    Asthma Sister    Diabetes Maternal Grandmother    Glaucoma Maternal Grandmother    Heart disease Maternal Grandmother    Heart disease Paternal Grandmother    Cancer Paternal Grandfather    No Known Allergies   Patient Care Team: Alveria Apley, NP as PCP - General (Family Medicine) Lorriane Shire, MD as Consulting Physician (Obstetrics and Gynecology)   Outpatient Medications Prior to Visit  Medication Sig   valACYclovir (VALTREX) 500 MG tablet Take 1 tablet (500 mg total) by mouth daily as needed (outbreak).   No facility-administered medications prior to visit.    Review of Systems  Constitutional: Negative.   HENT: Negative.    Eyes: Negative.   Respiratory:  Positive for cough (Allergy related).   Cardiovascular: Negative.   Gastrointestinal: Negative.   Genitourinary: Negative.   Musculoskeletal:  Positive for joint pain.  Skin: Negative.   Neurological: Negative.   Endo/Heme/Allergies:  Positive for environmental allergies.  Psychiatric/Behavioral:  Positive for depression. The patient is nervous/anxious.    See HPI above    Objective:   BP 124/78   Pulse 82   Temp 98.4 F (36.9 C) (Oral)   Ht 5\' 8"  (1.727 m)   Wt 196 lb (88.9 kg)   SpO2 99%   BMI 29.80 kg/m    Physical Exam Vitals reviewed.  Constitutional:      General: She is not in acute distress.    Appearance: Normal appearance. She is overweight. She is not ill-appearing or toxic-appearing.  HENT:     Head: Normocephalic and atraumatic.     Right Ear: Tympanic membrane, ear canal and external ear normal. There is no impacted  cerumen.     Left Ear: Tympanic membrane, ear canal and external ear normal. There is no impacted cerumen.     Nose:     Right Sinus: No maxillary sinus tenderness or frontal sinus tenderness.     Left Sinus: No maxillary sinus tenderness or frontal sinus tenderness.     Mouth/Throat:  Mouth: Mucous membranes are moist.     Pharynx: Oropharynx is clear. Uvula midline. No pharyngeal swelling, oropharyngeal exudate, posterior oropharyngeal erythema, uvula swelling or postnasal drip.  Eyes:     General:        Right eye: No discharge.        Left eye: No discharge.     Conjunctiva/sclera: Conjunctivae normal.     Pupils: Pupils are equal, round, and reactive to light.  Neck:     Thyroid: No thyromegaly.  Cardiovascular:     Rate and Rhythm: Normal rate and regular rhythm.     Pulses:          Popliteal pulses are 2+ on the right side and 2+ on the left side.     Heart sounds: Normal heart sounds. No murmur heard.    No friction rub. No gallop.  Pulmonary:     Effort: Pulmonary effort is normal. No respiratory distress.     Breath sounds: Normal breath sounds.  Abdominal:     General: Abdomen is flat. Bowel sounds are normal. There is no distension.     Palpations: Abdomen is soft. There is no mass.     Tenderness: There is no abdominal tenderness. There is no right CVA tenderness or left CVA tenderness.  Musculoskeletal:        General: Normal range of motion.     Cervical back: Normal range of motion and neck supple.     Right lower leg: No edema.     Left lower leg: No edema.  Lymphadenopathy:     Head:     Right side of head: No submental or submandibular adenopathy.     Left side of head: No submental or submandibular adenopathy.     Cervical: No cervical adenopathy.  Skin:    General: Skin is warm and dry.  Neurological:     General: No focal deficit present.     Mental Status: She is alert and oriented to person, place, and time. Mental status is at baseline.      Motor: No weakness.     Gait: Gait normal.  Psychiatric:        Mood and Affect: Mood normal.        Behavior: Behavior normal.        Thought Content: Thought content normal.        Judgment: Judgment normal.        Assessment & Plan:    Routine Health Maintenance and Physical Exam  Immunization History  Administered Date(s) Administered   Influenza,inj,Quad PF,6+ Mos 03/14/2020   PFIZER(Purple Top)SARS-COV-2 Vaccination 11/10/2019, 12/02/2019   Tdap 02/29/2020    Health Maintenance  Topic Date Due   COVID-19 Vaccine (3 - 2024-25 season) 11/29/2022   INFLUENZA VACCINE  10/29/2023   Cervical Cancer Screening (HPV/Pap Cotest)  03/02/2028   DTaP/Tdap/Td (2 - Td or Tdap) 02/28/2030   Hepatitis C Screening  Completed   HIV Screening  Completed   HPV VACCINES  Aged Out   Meningococcal B Vaccine  Aged Out    Discussed health benefits of physical activity, and encouraged her to engage in regular exercise appropriate for her age and condition.  Annual physical exam   1.Review health maintenance:  -Covid booster: Declines   2.Physical exam completed today. No concern. 3.Recommend a healthy diet and participate in regular exercise. 4.Schedule a follow up appointment for pap smear with GYN for February 2026 in the fall to have it completed. 5.She had labs  completed in October 2024 with no major concerns. Lipids mildly elevated.  -Follow up in 1 year for a physical and please be fasting. Return in about 1 year (around 07/14/2024) for physical.   Gazelle Towe, NP

## 2023-07-15 NOTE — Patient Instructions (Addendum)
-  Physical exam completed today. No concern. -Recommend a healthy diet and participate in regular exercise. -Schedule a follow up appointment for pap smear with GYN for February 2026 in the fall to have it completed. -Follow up in 1 year for a physical and please be fasting. -Happy Saverio Curling!

## 2023-07-16 ENCOUNTER — Encounter: Payer: 59 | Admitting: Family Medicine

## 2023-10-11 ENCOUNTER — Ambulatory Visit: Payer: Self-pay | Admitting: Family Medicine

## 2023-10-11 ENCOUNTER — Ambulatory Visit: Admitting: Family Medicine

## 2023-10-11 VITALS — BP 126/82 | HR 76 | Temp 98.9°F | Ht 68.0 in | Wt 196.0 lb

## 2023-10-11 DIAGNOSIS — R432 Parageusia: Secondary | ICD-10-CM | POA: Diagnosis not present

## 2023-10-11 LAB — TSH: TSH: 1.83 u[IU]/mL (ref 0.35–5.50)

## 2023-10-11 LAB — CBC WITH DIFFERENTIAL/PLATELET
Basophils Absolute: 0 K/uL (ref 0.0–0.1)
Basophils Relative: 0.8 % (ref 0.0–3.0)
Eosinophils Absolute: 0 K/uL (ref 0.0–0.7)
Eosinophils Relative: 0.6 % (ref 0.0–5.0)
HCT: 40.5 % (ref 36.0–46.0)
Hemoglobin: 13.4 g/dL (ref 12.0–15.0)
Lymphocytes Relative: 52.1 % — ABNORMAL HIGH (ref 12.0–46.0)
Lymphs Abs: 2.1 K/uL (ref 0.7–4.0)
MCHC: 33.1 g/dL (ref 30.0–36.0)
MCV: 83.5 fl (ref 78.0–100.0)
Monocytes Absolute: 0.3 K/uL (ref 0.1–1.0)
Monocytes Relative: 6.7 % (ref 3.0–12.0)
Neutro Abs: 1.6 K/uL (ref 1.4–7.7)
Neutrophils Relative %: 39.8 % — ABNORMAL LOW (ref 43.0–77.0)
Platelets: 311 K/uL (ref 150.0–400.0)
RBC: 4.85 Mil/uL (ref 3.87–5.11)
RDW: 14.4 % (ref 11.5–15.5)
WBC: 4.1 K/uL (ref 4.0–10.5)

## 2023-10-11 LAB — COMPREHENSIVE METABOLIC PANEL WITH GFR
ALT: 9 U/L (ref 0–35)
AST: 13 U/L (ref 0–37)
Albumin: 4.6 g/dL (ref 3.5–5.2)
Alkaline Phosphatase: 47 U/L (ref 39–117)
BUN: 7 mg/dL (ref 6–23)
CO2: 29 meq/L (ref 19–32)
Calcium: 9.6 mg/dL (ref 8.4–10.5)
Chloride: 103 meq/L (ref 96–112)
Creatinine, Ser: 0.8 mg/dL (ref 0.40–1.20)
GFR: 96.48 mL/min (ref >60.00)
Glucose, Bld: 91 mg/dL (ref 70–99)
Potassium: 4.5 meq/L (ref 3.5–5.1)
Sodium: 141 meq/L (ref 135–145)
Total Bilirubin: 0.4 mg/dL (ref 0.2–1.2)
Total Protein: 7.9 g/dL (ref 6.0–8.3)

## 2023-10-11 LAB — HEMOGLOBIN A1C: Hgb A1c MFr Bld: 6 % (ref 4.6–6.5)

## 2023-10-11 NOTE — Progress Notes (Signed)
   Established Patient Office Visit   Subjective:  Patient ID: Amanda Kerr, female    DOB: October 24, 1990  Age: 33 y.o. MRN: [id number]  Chief Complaint  Patient presents with   sweet taste    Sweet taste In mouth multi weeks.     HPI Patient is complaining of a sweet taste. It started all of a sudden about 2 weeks ago and it has improved in the last couple of days. She reports she was still able to drink and eat foods normally with no change in taste. However, after eating or drinking, or after brushing teeth, the sweet taste would return. She reports the back of her tongue seems more sweet. She reports when she swallows is when she taste it the most.   Denies any other symptoms, including any upper respiratory symptoms that can alter her taste. Reports her thirst and eating is the same, but urinating more frequently.  ROS See HPI above     Objective:   BP 126/82   Pulse 76   Temp 98.9 F (37.2 C) (Oral)   Ht 5' 8 (1.727 m)   Wt 196 lb (88.9 kg)   SpO2 99%   BMI 29.80 kg/m    Physical Exam Vitals reviewed.  Constitutional:      General: She is not in acute distress.    Appearance: Normal appearance. She is not ill-appearing, toxic-appearing or diaphoretic.  HENT:     Mouth/Throat:     Lips: Pink.     Mouth: Mucous membranes are moist. No lacerations or oral lesions.     Tongue: No lesions. Tongue does not deviate from midline.     Pharynx: Oropharynx is clear. Uvula midline. No pharyngeal swelling, oropharyngeal exudate, posterior oropharyngeal erythema, uvula swelling or postnasal drip.  Eyes:     General:        Right eye: No discharge.        Left eye: No discharge.     Conjunctiva/sclera: Conjunctivae normal.  Cardiovascular:     Rate and Rhythm: Normal rate.  Pulmonary:     Effort: Pulmonary effort is normal. No respiratory distress.  Musculoskeletal:        General: Normal range of motion.  Skin:    General: Skin is warm and dry.  Neurological:      General: No focal deficit present.     Mental Status: She is alert and oriented to person, place, and time. Mental status is at baseline.     Motor: No weakness.     Gait: Gait normal.  Psychiatric:        Mood and Affect: Mood normal.        Behavior: Behavior normal.        Thought Content: Thought content normal.        Judgment: Judgment normal.      Assessment & Plan:  Taste sense altered -     CBC with Differential/Platelet -     Comprehensive metabolic panel with GFR -     TSH -     Hemoglobin A1c  -Ordered labs (CBC, CMP, TSH, and A1c) for a reason for having a sweet taste. Concerned about diabetes and thyroid. Office will call with lab results and will be available on MyChart.  -If labs are stable and symptoms continue, recommend a referral to ENT.   Seda Kronberg, NP

## 2023-10-11 NOTE — Patient Instructions (Addendum)
-  Ordered labs (CBC, CMP, TSH, and A1c) for a reason for having a sweet taste. Concerned about diabetes and thyroid. Office will call with lab results and will be available on MyChart.  -If labs are stable and symptoms continue, recommend a referral to ENT.

## 2024-04-15 ENCOUNTER — Telehealth

## 2024-04-15 DIAGNOSIS — N760 Acute vaginitis: Secondary | ICD-10-CM

## 2024-04-15 DIAGNOSIS — B9689 Other specified bacterial agents as the cause of diseases classified elsewhere: Secondary | ICD-10-CM | POA: Diagnosis not present

## 2024-04-16 MED ORDER — METRONIDAZOLE 500 MG PO TABS: 500.0000 mg | ORAL_TABLET | Freq: Two times a day (BID) | ORAL | 0 refills | Status: AC

## 2024-04-16 NOTE — Progress Notes (Signed)
 We are sorry that you are not feeling well. Here is how we plan to help! Based on what you shared with me it looks like you: May have a vaginosis due to bacteria  Vaginosis is an inflammation of the vagina that can result in discharge, itching and pain. The cause is usually a change in the normal balance of vaginal bacteria or an infection. Vaginosis can also result from reduced estrogen levels after menopause.  The most common causes of vaginosis are:   Bacterial vaginosis which results from an overgrowth of one on several organisms that are normally present in your vagina.   Yeast infections which are caused by a naturally occurring fungus called candida.   Vaginal atrophy (atrophic vaginosis) which results from the thinning of the vagina from reduced estrogen levels after menopause.   Trichomoniasis which is caused by a parasite and is commonly transmitted by sexual intercourse.  Factors that increase your risk of developing vaginosis include: Medications, such as antibiotics and steroids Uncontrolled diabetes Use of hygiene products such as bubble bath, vaginal spray or vaginal deodorant Douching Wearing damp or tight-fitting clothing Using an intrauterine device (IUD) for birth control Hormonal changes, such as those associated with pregnancy, birth control pills or menopause Sexual activity Having a sexually transmitted infection  Your treatment plan is Metronidazole  or Flagyl  500mg  twice a day for 7 days.  I have electronically sent this prescription into the pharmacy that you have chosen.  Be sure to take all of the medication as directed. Stop taking any medication if you develop a rash, tongue swelling or shortness of breath. Mothers who are breast feeding should consider pumping and discarding their breast milk while on these antibiotics. However, there is no consensus that infant exposure at these doses would be harmful.  Remember that medication creams can weaken latex  condoms.   HOME CARE:  Good hygiene may prevent some types of vaginosis from recurring and may relieve some symptoms:  Avoid baths, hot tubs and whirlpool spas. Rinse soap from your outer genital area after a shower, and dry the area well to prevent irritation. Don't use scented or harsh soaps, such as those with deodorant or antibacterial action. Avoid irritants. These include scented tampons and pads. Wipe from front to back after using the toilet. Doing so avoids spreading fecal bacteria to your vagina.  Other things that may help prevent vaginosis include:  Don't douche. Your vagina doesn't require cleansing other than normal bathing. Repetitive douching disrupts the normal organisms that reside in the vagina and can actually increase your risk of vaginal infection. Douching won't clear up a vaginal infection. Use a latex condom. Both female and female latex condoms may help you avoid infections spread by sexual contact. Wear cotton underwear. Also wear pantyhose with a cotton crotch. If you feel comfortable without it, skip wearing underwear to bed. Yeast thrives in [employer] Your symptoms should improve in the next day or two.  GET HELP RIGHT AWAY IF:  You have pain in your lower abdomen ( pelvic area or over your ovaries) You develop nausea or vomiting You develop a fever Your discharge changes or worsens You have persistent pain with intercourse You develop shortness of breath, a rapid pulse, or you faint.  These symptoms could be signs of problems or infections that need to be evaluated by a medical provider now.  MAKE SURE YOU   Understand these instructions. Will watch your condition. Will get help right away if you are not doing  well or get worse.  Your e-visit answers were reviewed by a board certified advanced clinical practitioner to complete your personal care plan. Depending upon the condition, your plan could have included both over the counter or  prescription medications. Please review your pharmacy choice to make sure that you have choses a pharmacy that is open for you to pick up any needed prescription, Your safety is important to us . If you have drug allergies check your prescription carefully.   You can use MyChart to ask questions about today's visit, request a non-urgent call back, or ask for a work or school excuse for 24 hours related to this e-Visit. If it has been greater than 24 hours you will need to follow up with your provider, or enter a new e-Visit to address those concerns. You will get a MyChart message within the next two days asking about your experience. I hope that your e-visit has been valuable and will speed your recovery.  I have spent 5 minutes in review of e-visit questionnaire, review and updating patient chart, medical decision making and response to patient.   Bari Learn, FNP

## 2024-07-17 ENCOUNTER — Encounter: Admitting: Family Medicine
